# Patient Record
Sex: Female | Born: 1975
Health system: Southern US, Community
[De-identification: ages and names within clinical notes are randomized; demographics above are authoritative.]

## PROBLEM LIST (undated history)

## (undated) DIAGNOSIS — I499 Cardiac arrhythmia, unspecified: Secondary | ICD-10-CM

## (undated) DIAGNOSIS — K219 Gastro-esophageal reflux disease without esophagitis: Secondary | ICD-10-CM

## (undated) HISTORY — PX: TUBAL LIGATION: SHX77

---

## 1998-06-02 ENCOUNTER — Ambulatory Visit (HOSPITAL_COMMUNITY): Admission: RE | Admit: 1998-06-02 | Discharge: 1998-06-02 | Payer: Self-pay | Admitting: General Surgery

## 1998-06-02 ENCOUNTER — Encounter (HOSPITAL_BASED_OUTPATIENT_CLINIC_OR_DEPARTMENT_OTHER): Payer: Self-pay | Admitting: General Surgery

## 1999-08-30 ENCOUNTER — Ambulatory Visit (HOSPITAL_COMMUNITY): Admission: RE | Admit: 1999-08-30 | Discharge: 1999-08-30 | Payer: Self-pay | Admitting: *Deleted

## 1999-09-04 ENCOUNTER — Emergency Department (HOSPITAL_COMMUNITY): Admission: EM | Admit: 1999-09-04 | Discharge: 1999-09-04 | Payer: Self-pay | Admitting: Emergency Medicine

## 1999-10-25 ENCOUNTER — Ambulatory Visit (HOSPITAL_COMMUNITY): Admission: RE | Admit: 1999-10-25 | Discharge: 1999-10-25 | Payer: Self-pay | Admitting: *Deleted

## 1999-12-06 ENCOUNTER — Ambulatory Visit (HOSPITAL_COMMUNITY): Admission: RE | Admit: 1999-12-06 | Discharge: 1999-12-06 | Payer: Self-pay | Admitting: *Deleted

## 2000-01-05 ENCOUNTER — Inpatient Hospital Stay (HOSPITAL_COMMUNITY): Admission: AD | Admit: 2000-01-05 | Discharge: 2000-01-05 | Payer: Self-pay | Admitting: *Deleted

## 2000-01-10 ENCOUNTER — Ambulatory Visit (HOSPITAL_COMMUNITY): Admission: RE | Admit: 2000-01-10 | Discharge: 2000-01-10 | Payer: Self-pay | Admitting: *Deleted

## 2000-01-10 ENCOUNTER — Observation Stay (HOSPITAL_COMMUNITY): Admission: AD | Admit: 2000-01-10 | Discharge: 2000-01-11 | Payer: Self-pay | Admitting: Obstetrics

## 2000-01-10 ENCOUNTER — Encounter: Payer: Self-pay | Admitting: *Deleted

## 2000-01-11 ENCOUNTER — Encounter: Payer: Self-pay | Admitting: *Deleted

## 2000-01-13 ENCOUNTER — Inpatient Hospital Stay (HOSPITAL_COMMUNITY): Admission: AD | Admit: 2000-01-13 | Discharge: 2000-01-16 | Payer: Self-pay | Admitting: Obstetrics

## 2000-02-16 ENCOUNTER — Inpatient Hospital Stay (HOSPITAL_COMMUNITY): Admission: AD | Admit: 2000-02-16 | Discharge: 2000-02-16 | Payer: Self-pay | Admitting: Obstetrics

## 2000-07-28 ENCOUNTER — Other Ambulatory Visit: Admission: RE | Admit: 2000-07-28 | Discharge: 2000-07-28 | Payer: Self-pay | Admitting: Obstetrics

## 2000-07-31 ENCOUNTER — Encounter (INDEPENDENT_AMBULATORY_CARE_PROVIDER_SITE_OTHER): Payer: Self-pay

## 2000-07-31 ENCOUNTER — Other Ambulatory Visit: Admission: RE | Admit: 2000-07-31 | Discharge: 2000-07-31 | Payer: Self-pay | Admitting: Obstetrics

## 2001-06-23 ENCOUNTER — Inpatient Hospital Stay (HOSPITAL_COMMUNITY): Admission: AD | Admit: 2001-06-23 | Discharge: 2001-06-23 | Payer: Self-pay | Admitting: Obstetrics and Gynecology

## 2002-06-24 ENCOUNTER — Ambulatory Visit (HOSPITAL_COMMUNITY): Admission: RE | Admit: 2002-06-24 | Discharge: 2002-06-24 | Payer: Self-pay | Admitting: *Deleted

## 2002-12-10 ENCOUNTER — Encounter: Admission: RE | Admit: 2002-12-10 | Discharge: 2002-12-10 | Payer: Self-pay | Admitting: Obstetrics and Gynecology

## 2004-04-24 ENCOUNTER — Inpatient Hospital Stay (HOSPITAL_COMMUNITY): Admission: AD | Admit: 2004-04-24 | Discharge: 2004-04-26 | Payer: Self-pay | Admitting: *Deleted

## 2004-04-24 ENCOUNTER — Encounter (INDEPENDENT_AMBULATORY_CARE_PROVIDER_SITE_OTHER): Payer: Self-pay | Admitting: *Deleted

## 2006-02-14 ENCOUNTER — Ambulatory Visit: Payer: Self-pay | Admitting: Cardiovascular Disease

## 2006-02-22 ENCOUNTER — Encounter: Payer: Self-pay | Admitting: Cardiology

## 2006-02-22 ENCOUNTER — Ambulatory Visit: Payer: Self-pay

## 2006-05-12 ENCOUNTER — Ambulatory Visit: Payer: Self-pay | Admitting: Cardiovascular Disease

## 2006-05-23 ENCOUNTER — Inpatient Hospital Stay (HOSPITAL_COMMUNITY): Admission: AD | Admit: 2006-05-23 | Discharge: 2006-05-23 | Payer: Self-pay | Admitting: *Deleted

## 2006-05-24 ENCOUNTER — Inpatient Hospital Stay (HOSPITAL_COMMUNITY): Admission: AD | Admit: 2006-05-24 | Discharge: 2006-05-24 | Payer: Self-pay | Admitting: Obstetrics and Gynecology

## 2006-07-09 ENCOUNTER — Inpatient Hospital Stay (HOSPITAL_COMMUNITY): Admission: AD | Admit: 2006-07-09 | Discharge: 2006-07-11 | Payer: Self-pay | Admitting: Obstetrics and Gynecology

## 2006-07-12 ENCOUNTER — Encounter: Admission: RE | Admit: 2006-07-12 | Discharge: 2006-08-10 | Payer: Self-pay | Admitting: Obstetrics and Gynecology

## 2006-08-11 ENCOUNTER — Encounter: Admission: RE | Admit: 2006-08-11 | Discharge: 2006-09-10 | Payer: Self-pay | Admitting: Obstetrics and Gynecology

## 2006-09-11 ENCOUNTER — Encounter: Admission: RE | Admit: 2006-09-11 | Discharge: 2006-10-10 | Payer: Self-pay | Admitting: Obstetrics and Gynecology

## 2006-10-11 ENCOUNTER — Encounter: Admission: RE | Admit: 2006-10-11 | Discharge: 2006-11-10 | Payer: Self-pay | Admitting: Obstetrics and Gynecology

## 2006-11-11 ENCOUNTER — Encounter: Admission: RE | Admit: 2006-11-11 | Discharge: 2006-12-11 | Payer: Self-pay | Admitting: Obstetrics and Gynecology

## 2006-12-12 ENCOUNTER — Encounter: Admission: RE | Admit: 2006-12-12 | Discharge: 2007-01-10 | Payer: Self-pay | Admitting: Obstetrics and Gynecology

## 2007-01-11 ENCOUNTER — Encounter: Admission: RE | Admit: 2007-01-11 | Discharge: 2007-02-10 | Payer: Self-pay | Admitting: Obstetrics and Gynecology

## 2007-01-28 ENCOUNTER — Emergency Department (HOSPITAL_COMMUNITY): Admission: EM | Admit: 2007-01-28 | Discharge: 2007-01-28 | Payer: Self-pay | Admitting: Emergency Medicine

## 2007-02-11 ENCOUNTER — Encounter: Admission: RE | Admit: 2007-02-11 | Discharge: 2007-03-12 | Payer: Self-pay | Admitting: Obstetrics and Gynecology

## 2007-03-13 ENCOUNTER — Encounter: Admission: RE | Admit: 2007-03-13 | Discharge: 2007-04-12 | Payer: Self-pay | Admitting: Obstetrics and Gynecology

## 2007-04-13 ENCOUNTER — Encounter: Admission: RE | Admit: 2007-04-13 | Discharge: 2007-05-13 | Payer: Self-pay | Admitting: Obstetrics and Gynecology

## 2007-05-14 ENCOUNTER — Encounter: Admission: RE | Admit: 2007-05-14 | Discharge: 2007-06-09 | Payer: Self-pay | Admitting: Obstetrics and Gynecology

## 2007-06-11 ENCOUNTER — Encounter: Admission: RE | Admit: 2007-06-11 | Discharge: 2007-07-10 | Payer: Self-pay | Admitting: Obstetrics and Gynecology

## 2009-01-19 ENCOUNTER — Ambulatory Visit (HOSPITAL_COMMUNITY): Admission: RE | Admit: 2009-01-19 | Discharge: 2009-01-19 | Payer: Self-pay | Admitting: Family Medicine

## 2009-02-06 ENCOUNTER — Ambulatory Visit (HOSPITAL_COMMUNITY): Admission: RE | Admit: 2009-02-06 | Discharge: 2009-02-06 | Payer: Self-pay | Admitting: Obstetrics & Gynecology

## 2009-05-05 ENCOUNTER — Inpatient Hospital Stay (HOSPITAL_COMMUNITY): Admission: AD | Admit: 2009-05-05 | Discharge: 2009-05-05 | Payer: Self-pay | Admitting: Family Medicine

## 2009-06-01 ENCOUNTER — Ambulatory Visit: Payer: Self-pay | Admitting: Family Medicine

## 2009-11-06 ENCOUNTER — Ambulatory Visit (HOSPITAL_COMMUNITY): Admission: RE | Admit: 2009-11-06 | Discharge: 2009-11-06 | Payer: Self-pay | Admitting: Family Medicine

## 2010-03-18 ENCOUNTER — Inpatient Hospital Stay (HOSPITAL_COMMUNITY): Admission: AD | Admit: 2010-03-18 | Discharge: 2009-06-03 | Payer: Self-pay | Admitting: Obstetrics & Gynecology

## 2010-05-02 ENCOUNTER — Encounter: Payer: Self-pay | Admitting: Family Medicine

## 2010-07-02 LAB — CBC
HCT: 33.6 % — ABNORMAL LOW (ref 36.0–46.0)
Hemoglobin: 11.3 g/dL — ABNORMAL LOW (ref 12.0–15.0)
MCV: 87.1 fL (ref 78.0–100.0)
MCV: 87.7 fL (ref 78.0–100.0)
Platelets: 185 10*3/uL (ref 150–400)
RBC: 3.52 MIL/uL — ABNORMAL LOW (ref 3.87–5.11)
RBC: 3.86 MIL/uL — ABNORMAL LOW (ref 3.87–5.11)

## 2010-08-09 ENCOUNTER — Inpatient Hospital Stay (INDEPENDENT_AMBULATORY_CARE_PROVIDER_SITE_OTHER)
Admission: RE | Admit: 2010-08-09 | Discharge: 2010-08-09 | Disposition: A | Discharge status: Home / Self Care | Payer: Self-pay | Source: Ambulatory Visit | Attending: Family Medicine | Admitting: Family Medicine

## 2010-08-09 DIAGNOSIS — S40029A Contusion of unspecified upper arm, initial encounter: Secondary | ICD-10-CM

## 2010-08-27 NOTE — Discharge Summary (Signed)
[facility]  Patient:    [name], [name]                       MRN: [id number] Adm. Date:  [id number] Disc. Date: [id number] Attending:  Michaelle Copas Dictator:   Kevin Fenton, M.D.                           Discharge Summary  ADMITTING DIAGNOSES:          1. Intrauterine pregnancy at term.                               2. Placenta previa.  PROCEDURES:                   Primary low transverse cesarean section.  BRIEF HOSPITAL COURSE:        This is a 36 year old G4, P1-0-2-1 at 37 weeks and 5 days by 19 week ultrasound who is here for a cesarean section secondary to placenta previa.  Patient underwent primary low transverse cesarean section without complications.  Patient had an uncomplicated postoperative course and was discharged home on the third postoperative day.  Patient was discharged home on ibuprofen 800 mg q.6h., Percocet one to two tabs q.4h. as needed, iron sulfate 325 mg b.i.d. for 12 weeks secondary to hemoglobin of 7.9, prenatal vitamins one every day for the next six weeks, and Colace 100 mg b.i.d. as needed to soften stools.  [facility] six [facility] postpartum check.  She was given follow-up instructions as to when to return ______ including fever, foul discharge, incision opening.  Patients staples were removed prior to discharge. DD:  01/16/00 TD:  01/17/00 Job: [id number]

## 2010-08-27 NOTE — Assessment & Plan Note (Signed)
[facility]                            CARDIOLOGY OFFICE NOTE   NAME:[name], [name]                        MRN:          [id number]  DATE:05/12/2006                            DOB:          Jul 01, 1975    [name] returns today for followup.  She is gravida 4 para 3.  I have seen  her in the past for PACs and PVCs.  Her last echo done in November of  2007 showed normal structural heart with no evidence of congenital heart  disease.  She has not noticed any significant palpitations since I last  saw her.  Her pregnancy is going well.  Her due date is the beginning of  April.  She has had a vaginal delivery followed by a C-section, then a V-  BAK.  I think she is going to have another vaginal delivery.  From a  cardiac perspective she is stable.  She is only taking prenatal  vitamins.  She has actually gained a little more weight with this  pregnancy and has tried to stay more active, working out and walking on  the treadmill at the gym.  She has not had any significant chest pain,  palpitations, or syncope.   Her blood pressure has been running somewhat low.  I told her to make  sure she stayed hydrated and to lie on her left side should she feel  presyncopal.   Her weight today is 153, with weight in November of 137.  Her blood  pressure is 100/59, pulse is 93 and regular.  Lungs are clear.  Carotids  are normal.  S1, S2 with normal heart sounds.  Abdomen is protuberant  consistent with gestational age.  Distal pulses are intact, with trace  edema.   IMPRESSION:  Stable.  Benign PACs and PVCs.  No evidence of structural  heart disease.  Should not have any problems with her delivery.  I will  see her on a p.r.n. basis.  Her echos in the past and in November showed  no valvular heart disease, no structural heart disease, and no history  of pericardium cardiomyopathy.  She will stay hydrated and follow up  with Dr. Cherly Hensen.  She is going to have her baby  at [facility].  We would be happy to see her should she have any arrhythmic problems  during the delivery or afterwards.     Noralyn Pick. Eden Emms, MD, [facility]  Electronically Signed    PCN/MedQ  DD: 05/12/2006  DT: 05/12/2006  Job #: [id number]   cc:   Maxie Better, M.D.

## 2010-08-27 NOTE — H&P (Signed)
[facility] ADMISSION   NAME:Wil[place], [facility]                        MRN:          [id number]  DATE:02/14/2006                            DOB:          [birth year]    REFERRING PHYSICIAN:  Maxie Better, M.D.   Ms. Hollon is seen today at the request of Dr. Cherly Hensen.   She is 35 years old.  She has been seen at the [facility] in 2001 by Dr.  Juanda Chance and in 2004 by myself.  She is, I believe, gravid 7, para 4.   Ever since we have known [place], she has had benign PACs and PVCs.  She tends  to have them all the time and we get consulted on her for these.  Around the  time of her pregnancy, she had echoes in 2001 and 2004 that were normal.  There was no evidence for congenital heart disease and good LV and RV  function.   She is currently 16 weeks' pregnant and has had increasing shortness of  breath.  In talking to her, the shortness of breath sounds physiologic.  It  particularly is bad going up steps.  She also tends to have shortness of  breath when her 3 children act up and cause her stress.  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   She does not have a history of lung disease.  There is no previous history  of cardiomyopathy or PE.  There has been no pleuritic pain and no cough or  sputum production.   She has only been taking prenatal vitamins.   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  She has not been overly active since she  has been pregnant.  She is to have an ultrasound of the baby next week.  She  is currently a gestational age of [redacted] weeks.   She is supposed to have a vaginal delivery.  She has had both previously.   REVIEW OF SYSTEMS:  Otherwise benign.  She has never had syncope and does  not notice any palpitations.   FAMILY HISTORY:  Remarkable for no significant cardiomyopathy and no  premature heart disease.  She does not drink or smoke.   EXAM:  She is  a thin, healthy appearing black female.  Her blood pressure is  low at 98/60.  Her pulse is 80 with occasional PACs.  HEENT:  Normal.  There is no lymphadenopathy.  There is no thyromegaly.  LUNGS:  Clear.  Carotids are normal.  There is a S1, S2 with normal heart sounds.  ABDOMEN:  Protuberant, consistent with gestational age.  Distal pulses are intact with no edema.   EKG shows a sinus rhythm with occasional PVCs and is similar to previous  EKGs we have had on her.   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  I think it is  reasonable to recheck an echocardiogram since it has been over 3 years.  So  long as it does not  show evidence of structural heart disease, I will just  follow her.  I think her shortness of breath is physiologic.  I find nothing  on examination to suggest that there is pathological dyspnea.   Since her blood pressure is low, I would not treat her with beta blockers at  this time in regards to her PACs and PVCs, as she has had them for a long  time and has not had any complications from her previous deliveries.   We will do a 2D echocardiogram.  So long as it is normal, I will see her  back in about 8 weeks and we will be happy to follow her along during her  pregnancy.    ______________________________  Noralyn Pick Eden Emms, MD, [facility]    PCN/MedQ  DD: 02/14/2006  DT: 02/14/2006  Job #: [id number]   cc:   Maxie Better, M.D.

## 2010-08-27 NOTE — Group Therapy Note (Signed)
   [name], [name]                          ACCOUNT NO.:  [id number]   MEDICAL RECORD NO.:  [id number]                   PATIENT TYPE:  OUT   LOCATION:  [facility]                           FACILITY:  [facility]   PHYSICIAN:  Elsie Lincoln, MD                   DATE OF BIRTH:  1976/01/16   DATE OF SERVICE:  12/10/2002                                    CLINIC NOTE   HISTORY OF PRESENT ILLNESS:  The patient is a 35 year old para 2-0-3-2 with  a history of an abnormal Pap smear done at the [facility] department with  followup biopsy done here at this clinic in April 2004.  Biopsy was negative  and endocervical curettage was also negative.  The patient had a followup  Pap smear at the [facility] yesterday and results are unknown.  Also,  the patient comes for followup of transvaginal ultrasound that was performed  in April 2004.  The practitioner at the health department in March 2004 had  questionably felt an adnexal mass and sent her for this ultrasound.   Ultrasound report showed a normal size uterus with an endometrial stripe of  0.4 cm.  The right ovary was 3 x 1.5 x 2 with a follicular cyst ovary with a  2 x 2.5 x 2 cm and had normal ultrasound appearance.  There was no free  fluid.  This is a normal ultrasound.  The patient has never had any pain or  abnormal bleeding.  The patient has no complaints today and no changes in  health status.  She is on Ortho Evra for her birth control and as stated  earlier she follows up with the health department for her Pap smears.  There  is no medical issue today.   PLAN:  The patient is to return to the health department for Pap smears.  If  there is an issue with pain or any other gynecologic issues the patient is  to come to this clinic.                                               Elsie Lincoln, MD    KL/MEDQ  D:  12/10/2002  T:  12/10/2002  Job:  [id number]

## 2010-08-27 NOTE — Op Note (Signed)
[facility]  Patient:    [name], [name]                       MRN: [id number] Proc. Date: 01/13/00 Adm. Date:  [id number] Disc. Date: [id number] Attending:  Michaelle Copas                           Operative Report  PREOPERATIVE DIAGNOSIS:       Intrauterine pregnancy at 37 weeks with placenta previa.  POSTOPERATIVE DIAGNOSIS:      Intrauterine pregnancy at 37 weeks with placenta previa.  OPERATION:                    Low transverse cesarean.  OPERATOR:                     Conni Elliot, M.D.  ANESTHESIA:                   Spinal followed by general.  OPERATIVE FINDINGS:           Female infant with Apgars of 8 and 9 at one and five respectively.  PROCEDURE:                    After attempting spinal anesthetic which was ineffective, the patient was prepped and draped in a sterile fashion.  The patient was then placed under general oral tracheal anesthesia.  Abdomen was entered through a low transverse Pfannenstiel incision.  Incision made in skin, extended to fascia.  Rectus muscle separated in the midline.  Peritoneal cavity and bladder flap created.  A low transuterine incision was made.  Baby was delivered from the vertex presentation.  Cord double clamped and cut and baby handed to neonatologists in attendance.  Placenta ______ spontaneously. Uterus, bladder flap, anteperitoneum, fascia, skin were closed ______. Estimated blood loss approximately 1000 cc without replacement.  Instruments were correct. DD:  02/10/00 TD:  02/10/00 Job: [id number]

## 2010-11-22 IMAGING — US US ABDOMEN COMPLETE
1 series · 14 of 25 positions shown · non-contrast
Comparison: None.

CLINICAL DATA: Abdominal pain, gastroesophageal reflux disease.

COMPLETE ABDOMINAL ULTRASOUND

[Series 1: us abdomen complete · 0.21mm/px · 14 of 73 slices shown]
[im 1/73]
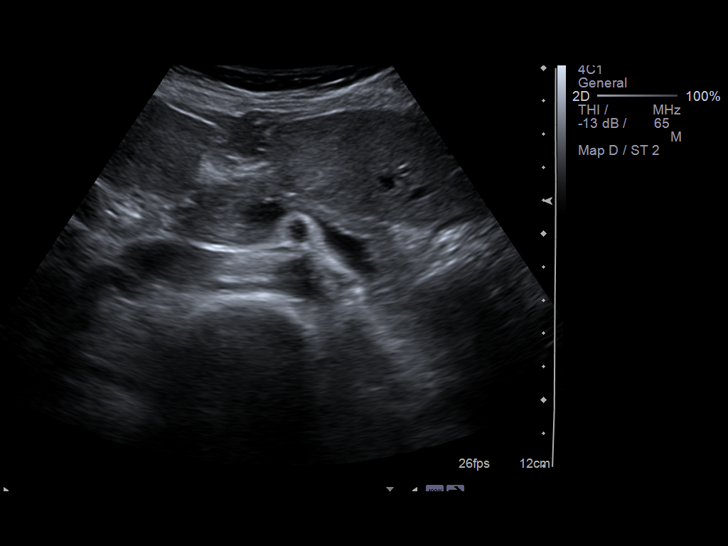
[im 7/73]
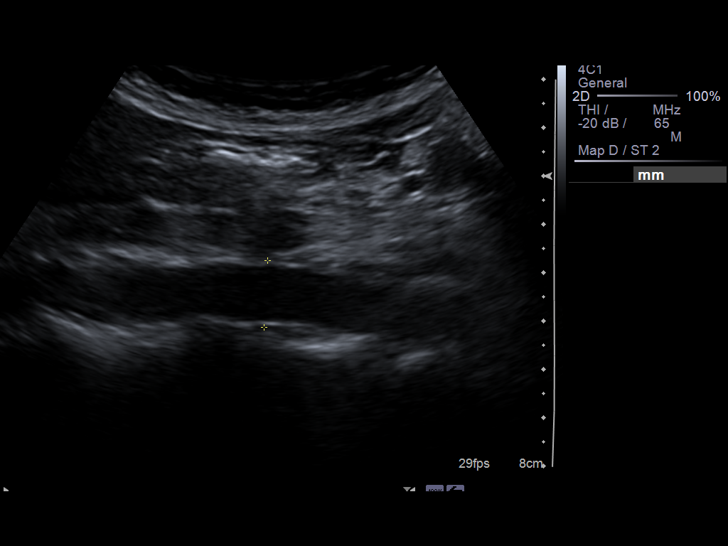
[im 13/73]
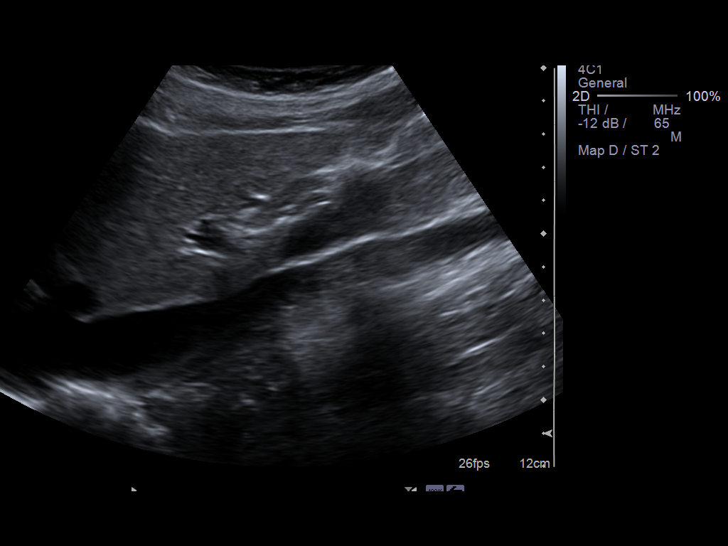
[im 19/73]
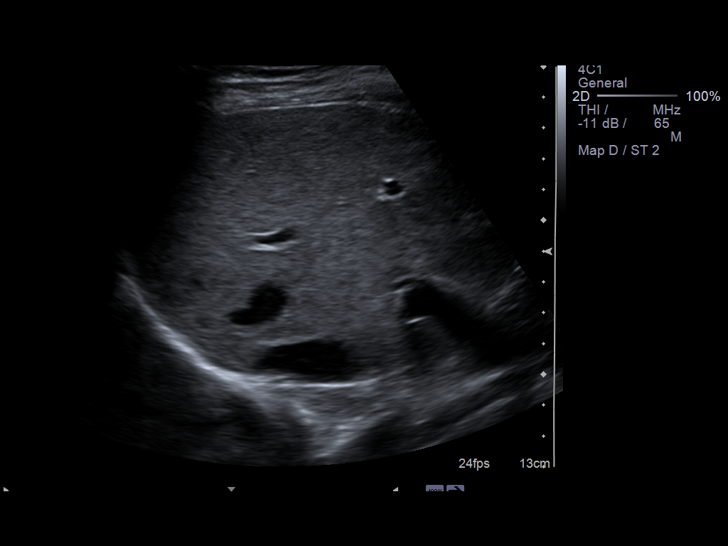
[im 25/73]
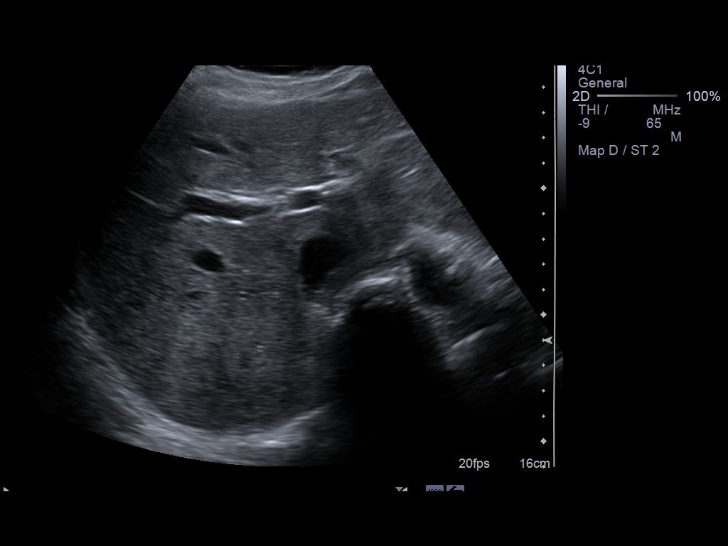
[im 28/73]
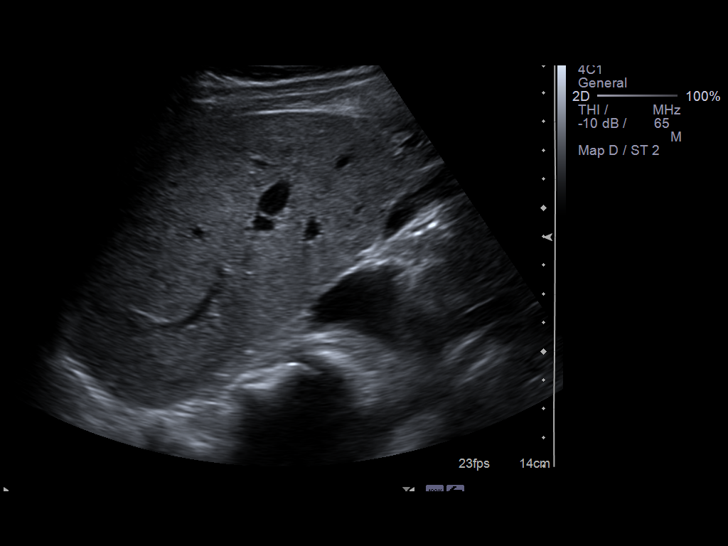
[im 34/73]
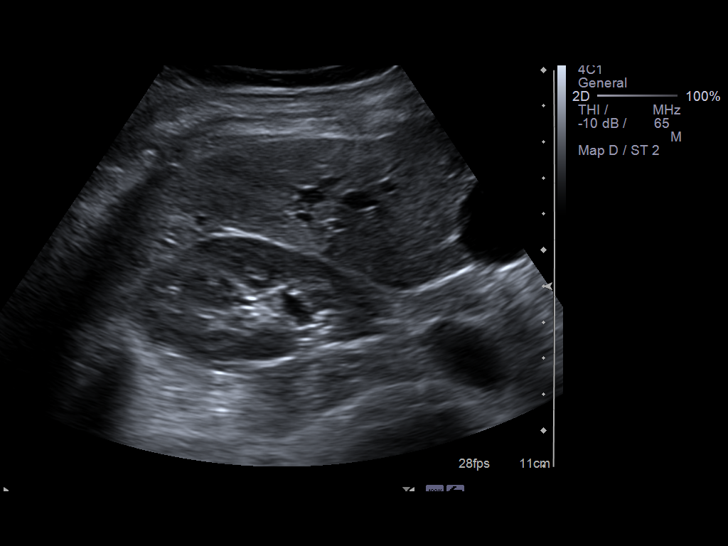
[im 40/73]
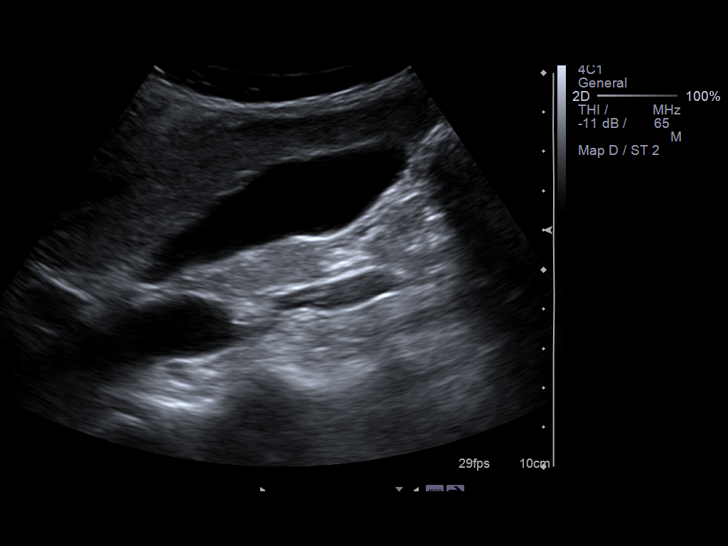
[im 46/73]
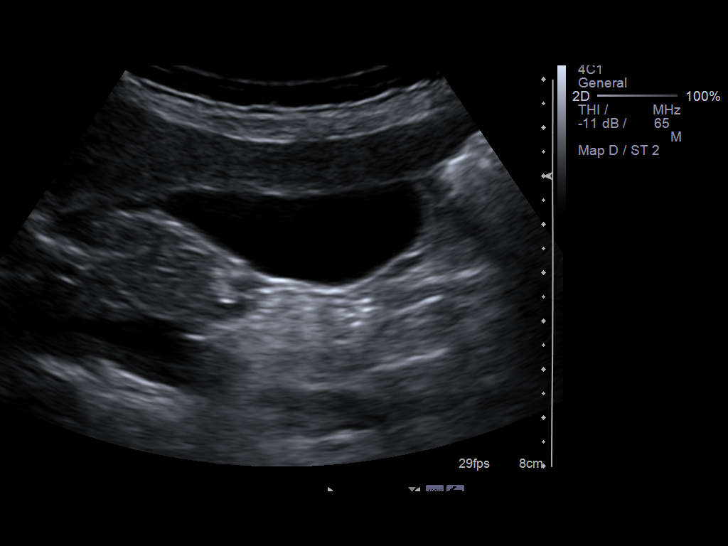
[im 49/73]
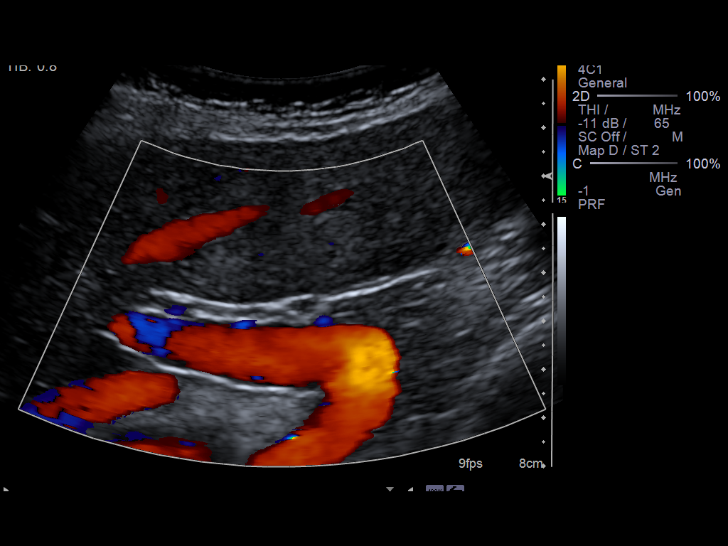
[im 55/73]
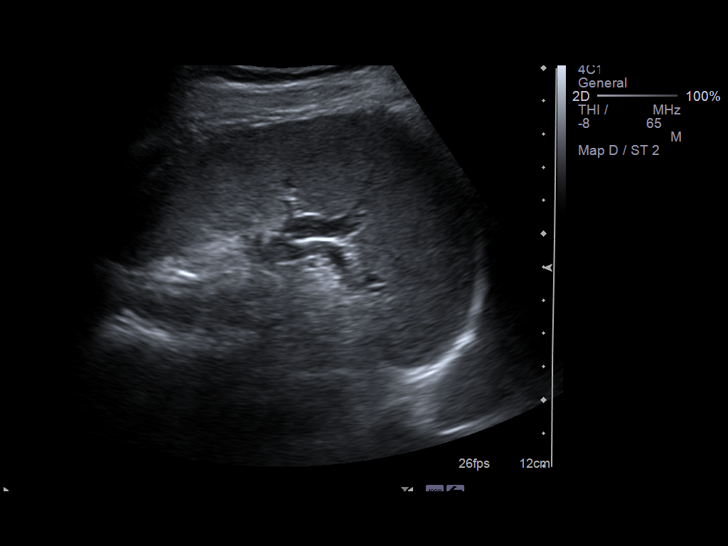
[im 61/73]
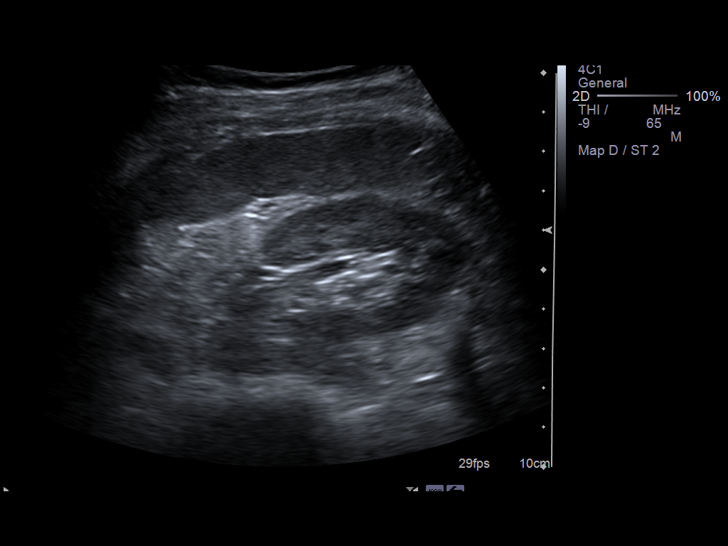
[im 67/73]
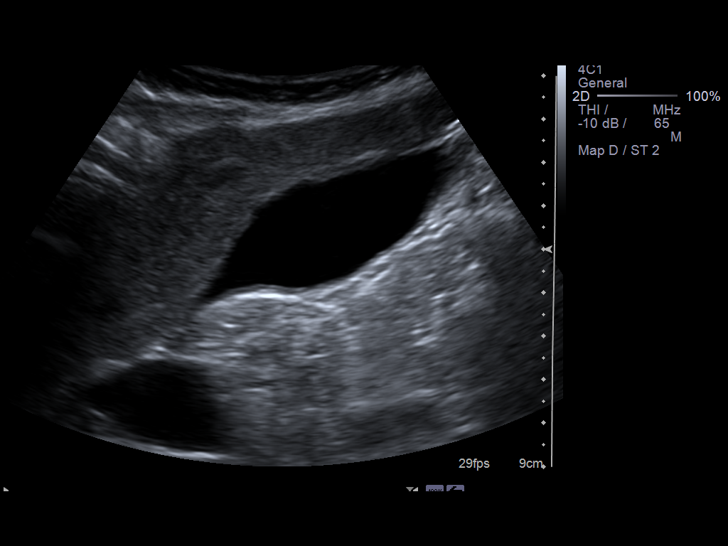
[im 73/73]
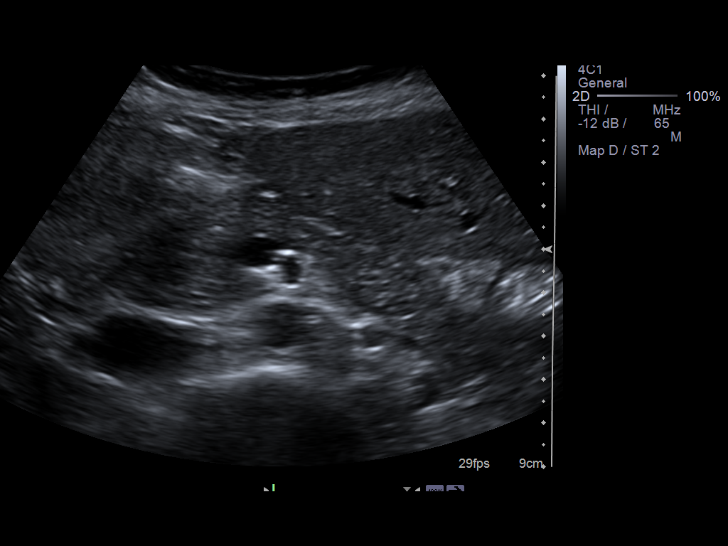

[14 of 25 positions shown; findings below may reference images not displayed]

FINDINGS: Gallbladder:  Sonographically normal without sludge or gallstones
with normal 2 mm wall thickness and no sonographic Murphy's sign
evoked.

Common bile duct:  No dilated intrahepatic or extrahepatic bile
ducts with common bile duct measuring normally at 2 mm.

Liver:  No focal lesion identified.  Within normal limits in
parenchymal echogenicity.

IVC:  Appears normal.

Pancreas:  No focal abnormality seen.

Spleen:  Sonographically normal measuring 9.9 cm long.

Right Kidney:  Sonographically normal measuring 10.2 cm long.

Left Kidney:  Sonographically normal measuring 10.2 cm long.

Abdominal aorta:  No aneurysm identified with maximum proximal
diameter 2.0 cm.

No free fluid.
IMPRESSION: Normal.

## 2010-11-22 IMAGING — RF DG ESOPHAGUS
14 of 24 series · 14 of 24 positions shown · non-contrast
Comparison: [HOSPITAL] abdominal ultrasound 11/06/2009.

CLINICAL DATA: Abdominal pain, gastroesophageal reflux disease.

ESOPHOGRAM/BARIUM SWALLOW
TECHNIQUE: Combined double contrast and single contrast
examination performed using effervescent crystals, thick barium
liquid, and thin barium liquid. 13mm barium tablet ingested with
water.
Fluoroscopy time:  1.6 minutes.

[Series 1: run · 1 of 3 slices shown (1 of 14)]
[im 1/3]
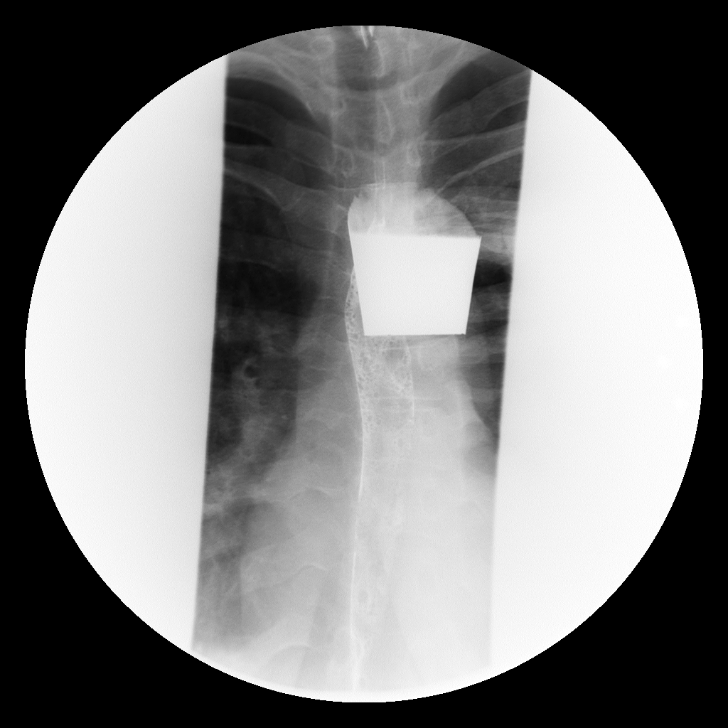

[Series 3: run · 1 of 1 slices shown (2 of 14)]
[im 1/1]
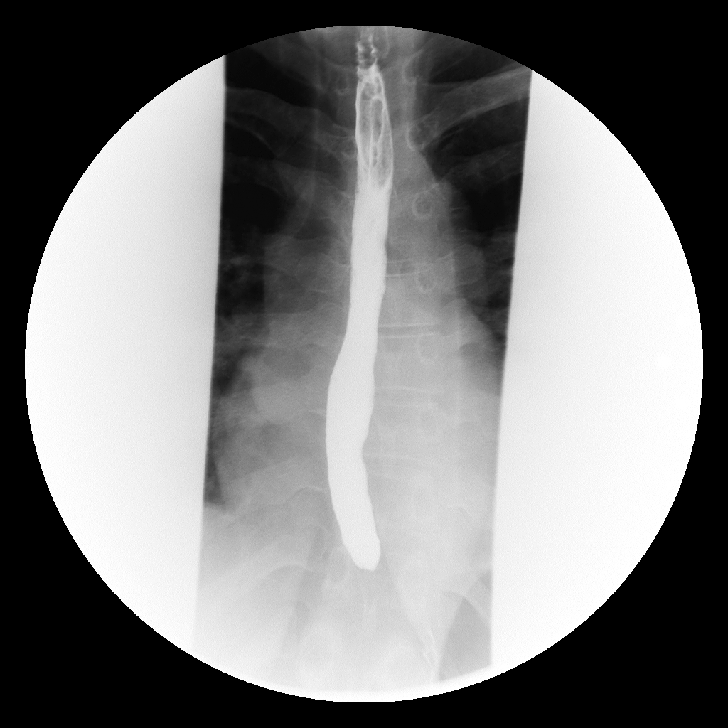

[Series 5: run · 1 of 1 slices shown (3 of 14)]
[im 1/1]
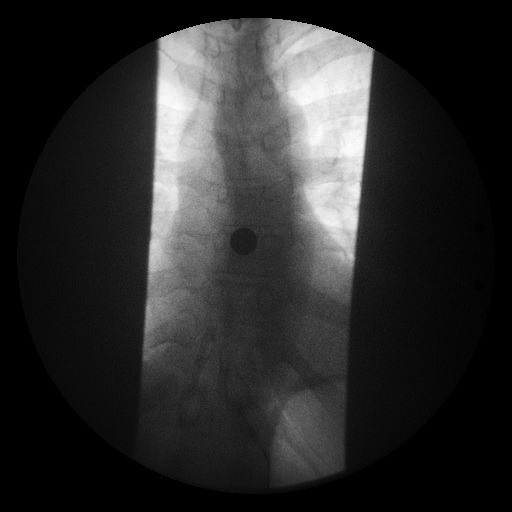

[Series 7: run · 1 of 1 slices shown (4 of 14)]
[im 1/1]
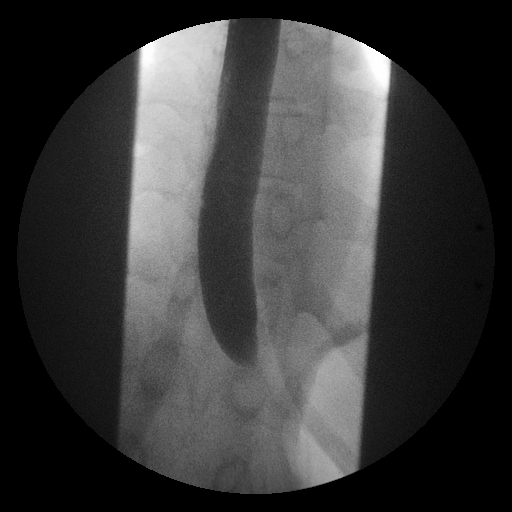

[Series 8: run · 1 of 1 slices shown (5 of 14)]
[im 1/1]
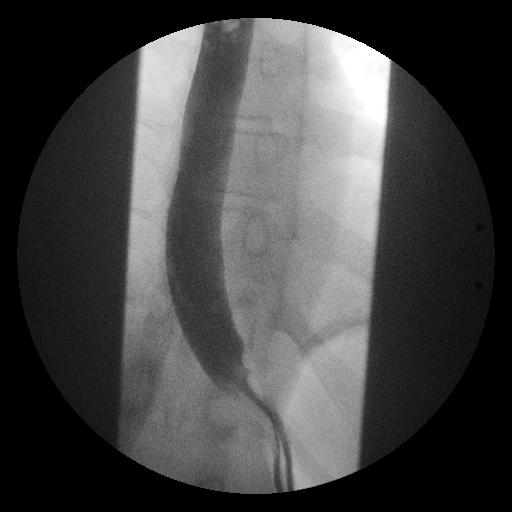

[Series 11: run · 1 of 1 slices shown (6 of 14)]
[im 1/1]
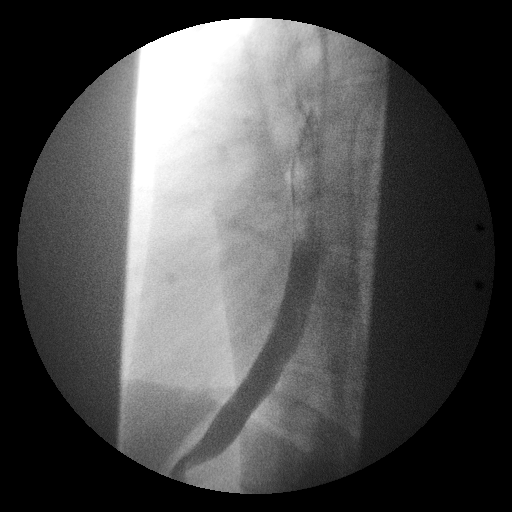

[Series 13: run · 1 of 7 slices shown (7 of 14)]
[im 1/7]
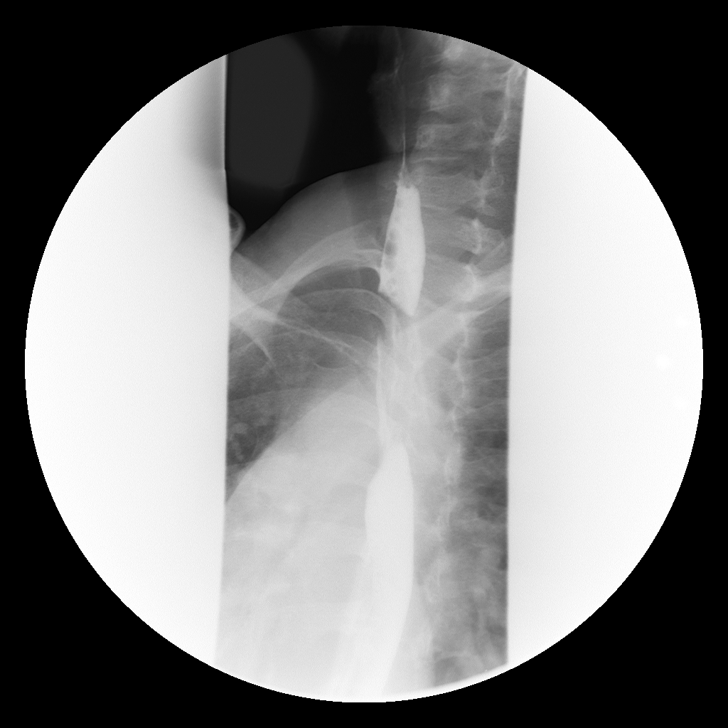

[Series 14: run · 1 of 2 slices shown (8 of 14)]
[im 1/2]
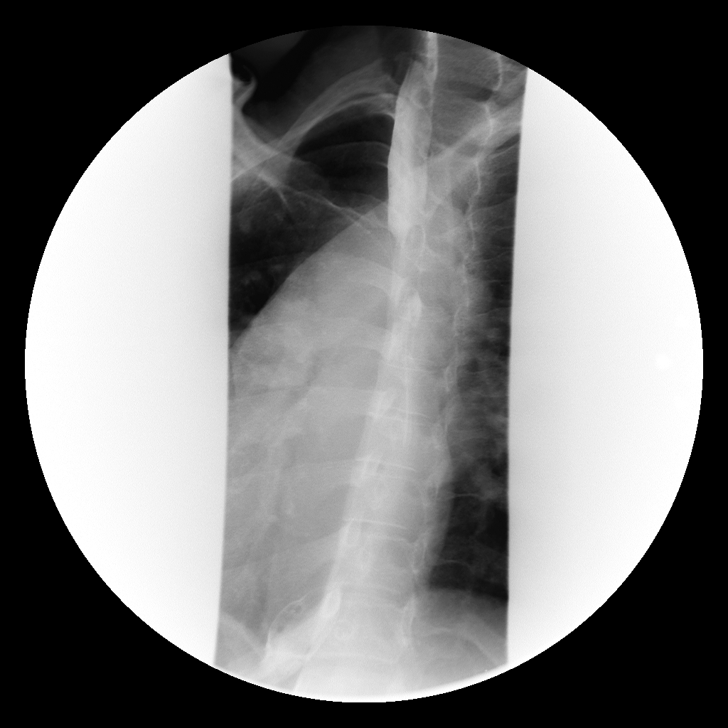

[Series 16: run · 1 of 3 slices shown (9 of 14)]
[im 1/3]
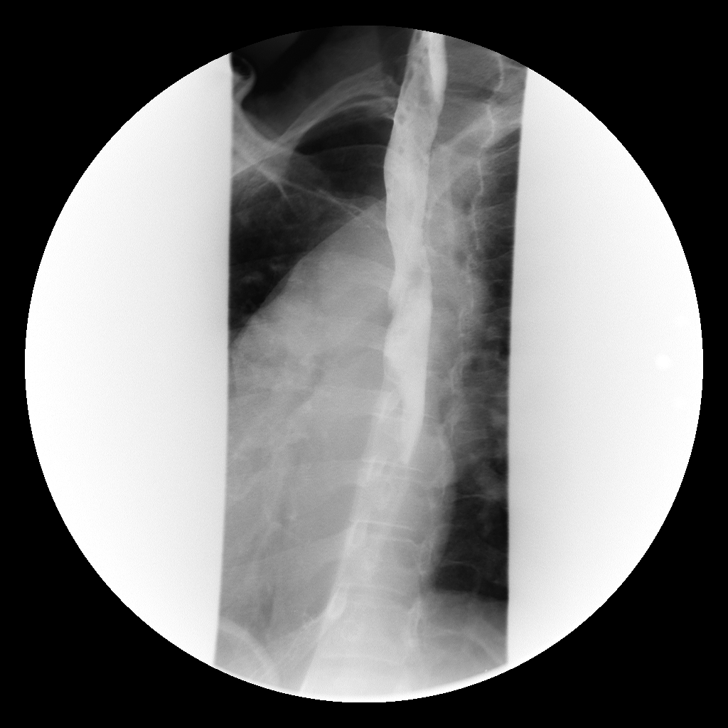

[Series 18: run · 1 of 1 slices shown (10 of 14)]
[im 1/1]
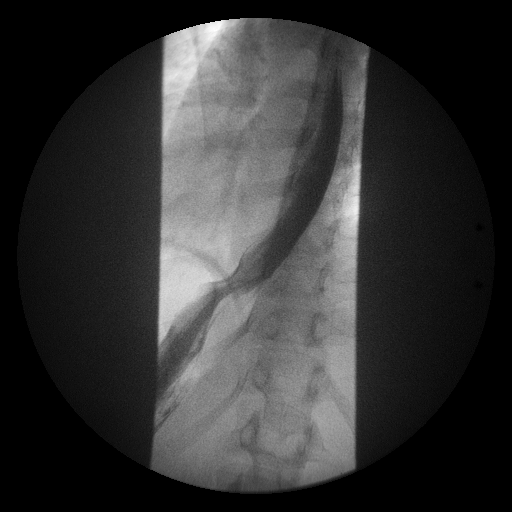

[Series 20: run · 1 of 1 slices shown (11 of 14)]
[im 1/1]
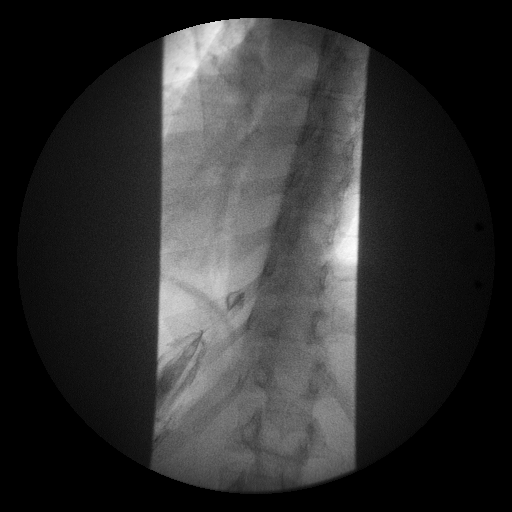

[Series 21: run · 1 of 1 slices shown (12 of 14)]
[im 1/1]
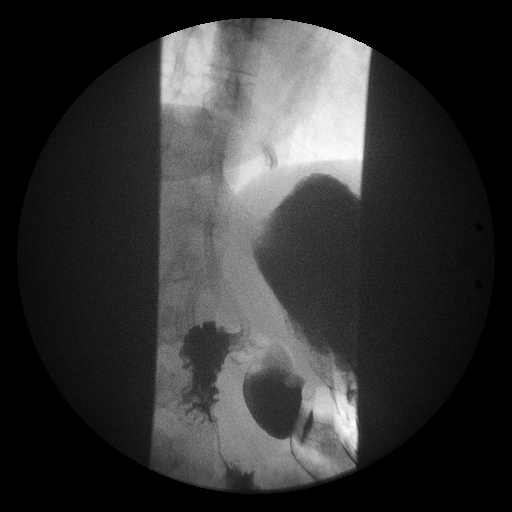

[Series 23: run · 1 of 1 slices shown (13 of 14)]
[im 1/1]
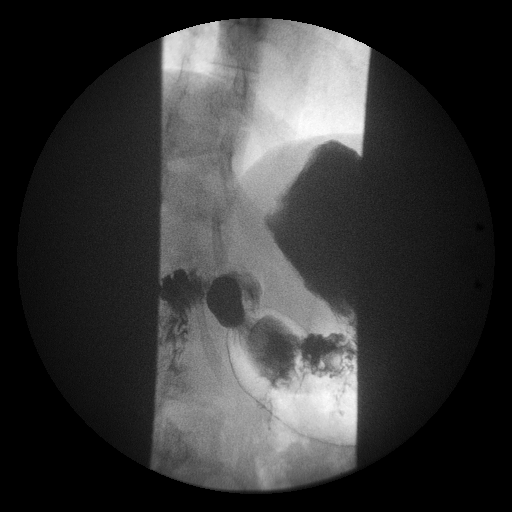

[Series 25: run · 1 of 1 slices shown (14 of 14)]
[im 1/1]
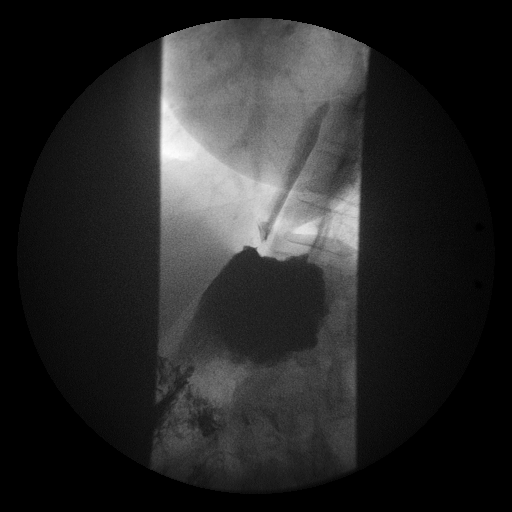

[14 of 24 positions shown; findings below may reference images not displayed]

FINDINGS: Normal antegrade peristalsis is seen through the
cervical and thoracic esophagus with no intrinsic or extrinsic
esophageal lesions.  Ingested 13 mm barium tablet readily passed
into the stomach.  No spontaneous with slight induced
gastroesophageal reflux (Valsalva/water siphon).
IMPRESSION: 1.  Slight induced gastroesophageal reflux.
2.  Otherwise, normal.

## 2011-01-26 ENCOUNTER — Other Ambulatory Visit: Payer: Self-pay | Admitting: Family Medicine

## 2011-01-26 ENCOUNTER — Other Ambulatory Visit (HOSPITAL_COMMUNITY)
Admission: RE | Admit: 2011-01-26 | Discharge: 2011-01-26 | Disposition: A | Discharge status: Home / Self Care | Payer: 59 | Source: Ambulatory Visit | Attending: Family Medicine | Admitting: Family Medicine

## 2011-01-26 DIAGNOSIS — Z1159 Encounter for screening for other viral diseases: Secondary | ICD-10-CM | POA: Insufficient documentation

## 2011-01-26 DIAGNOSIS — Z01419 Encounter for gynecological examination (general) (routine) without abnormal findings: Secondary | ICD-10-CM | POA: Insufficient documentation

## 2011-04-26 ENCOUNTER — Encounter (HOSPITAL_COMMUNITY): Payer: Self-pay | Admitting: *Deleted

## 2011-04-26 ENCOUNTER — Emergency Department (INDEPENDENT_AMBULATORY_CARE_PROVIDER_SITE_OTHER)
Admission: EM | Admit: 2011-04-26 | Discharge: 2011-04-26 | Disposition: A | Discharge status: Home / Self Care | Payer: 59 | Source: Home / Self Care | Attending: Emergency Medicine | Admitting: Emergency Medicine

## 2011-04-26 DIAGNOSIS — R059 Cough, unspecified: Secondary | ICD-10-CM

## 2011-04-26 DIAGNOSIS — K219 Gastro-esophageal reflux disease without esophagitis: Secondary | ICD-10-CM | POA: Insufficient documentation

## 2011-04-26 DIAGNOSIS — R058 Other specified cough: Secondary | ICD-10-CM

## 2011-04-26 DIAGNOSIS — R05 Cough: Secondary | ICD-10-CM

## 2011-04-26 HISTORY — DX: Gastro-esophageal reflux disease without esophagitis: K21.9

## 2011-04-26 HISTORY — DX: Cardiac arrhythmia, unspecified: I49.9

## 2011-04-26 MED ORDER — AZITHROMYCIN 250 MG PO TABS: 250.0000 mg | ORAL_TABLET | Freq: Every day | ORAL | Status: AC

## 2011-04-26 MED ORDER — PREDNISONE 10 MG PO TABS: 20.0000 mg | ORAL_TABLET | Freq: Every day | ORAL | Status: AC

## 2011-04-26 NOTE — ED Notes (Signed)
Onset 5 days ago upper  chest tightness with a  Productive cough.  She has continued to cough and has post nasal drip , but denies fever, ear pain or sore throat.

## 2011-04-26 NOTE — ED Provider Notes (Signed)
History     CSN: [id number]  Arrival date & time 04/26/11  0919   First MD Initiated Contact with Patient 04/26/11 1023      Chief Complaint  Patient presents with  . URI    (Consider location/radiation/quality/duration/timing/severity/associated sxs/prior treatment) HPI Comments: Been coughing with tighteness sensation in my chest, have this yellow-greenish sputum, its sore right here ( see illustration)  Patient is a 36 y.o. female presenting with URI. The history is provided by the patient.  URI The primary symptoms include cough. Primary symptoms do not include fever, sore throat, wheezing, vomiting, myalgias, arthralgias or rash. The current episode started 3 to 5 days ago. This is a new problem. The problem has not changed since onset. The illness is not associated with chills, sinus pressure or congestion.    Past Medical History  Diagnosis Date  . GERD (gastroesophageal reflux disease)   . Irregular heart rate     Past Surgical History  Procedure Date  . Tubal ligation     Family History  Problem Relation Age of Onset  . Asthma Mother     History  Substance Use Topics  . Smoking status: Never Smoker   . Smokeless tobacco: Not on file  . Alcohol Use: No    OB History    Grav Para Term Preterm Abortions TAB SAB Ect Mult Living                  Review of Systems  Constitutional: Negative for fever and chills.  HENT: Negative for congestion, sore throat and sinus pressure.   Respiratory: Positive for cough. Negative for wheezing.   Gastrointestinal: Negative for vomiting.  Musculoskeletal: Negative for myalgias and arthralgias.  Skin: Negative for rash.    Allergies  Review of patient's allergies indicates no known allergies.  Home Medications   Current Outpatient Rx  Name Route Sig Dispense Refill  . LANSOPRAZOLE 15 MG PO CPDR Oral Take 15 mg by mouth daily.    . AZITHROMYCIN 250 MG PO TABS Oral Take 1 tablet (250 mg total) by mouth daily.  Take first 2 tablets together, then 1 every day until finished. 6 tablet 0  . PREDNISONE 10 MG PO TABS Oral Take 2 tablets (20 mg total) by mouth daily. 15 tablet 0    BP 105/72  Pulse 75  Temp(Src) 98.3 F (36.8 C) (Oral)  Resp 20  SpO2 100%  LMP 04/09/2011  Physical Exam  Constitutional: She appears well-developed and well-nourished. No distress.  HENT:  Head: Atraumatic.  Right Ear: Tympanic membrane normal.  Left Ear: Tympanic membrane normal.  Mouth/Throat: Uvula is midline, oropharynx is clear and moist and mucous membranes are normal.  Eyes: Conjunctivae are normal. Right eye exhibits no discharge. Left eye exhibits no discharge.  Neck: Neck supple. No thyromegaly present.  Pulmonary/Chest: Effort normal. No accessory muscle usage. No respiratory distress. She has no wheezes. She has no rales. She exhibits no tenderness.    Lymphadenopathy:    She has no cervical adenopathy.  Skin: No rash noted.    ED Course  Procedures (including critical care time)  Labs Reviewed - No data to display No results found.   1. Cough with sputum       MDM  Afebrile, productive cough and parasternal focal pain. NORMAL  LUNG Meridee Score        Jimmie Molly, MD 04/26/11 [phone]

## 2011-05-17 ENCOUNTER — Encounter (HOSPITAL_COMMUNITY): Payer: Self-pay | Admitting: *Deleted

## 2011-05-17 ENCOUNTER — Emergency Department (INDEPENDENT_AMBULATORY_CARE_PROVIDER_SITE_OTHER)
Admission: EM | Admit: 2011-05-17 | Discharge: 2011-05-17 | Disposition: A | Discharge status: Home / Self Care | Payer: 59 | Source: Home / Self Care | Attending: Family Medicine | Admitting: Family Medicine

## 2011-05-17 DIAGNOSIS — J310 Chronic rhinitis: Secondary | ICD-10-CM

## 2011-05-17 DIAGNOSIS — B309 Viral conjunctivitis, unspecified: Secondary | ICD-10-CM

## 2011-05-17 LAB — POCT RAPID STREP A: Streptococcus, Group A Screen (Direct): NEGATIVE

## 2011-05-17 MED ORDER — FLUTICASONE PROPIONATE 50 MCG/ACT NA SUSP: 2.0000 | Freq: Every day | NASAL | Status: DC

## 2011-05-17 MED ORDER — POLYMYXIN B-TRIMETHOPRIM 10000-0.1 UNIT/ML-% OP SOLN: 1.0000 [drp] | OPHTHALMIC | Status: AC

## 2011-05-17 NOTE — ED Provider Notes (Signed)
History     CSN: [id number]  Arrival date & time 05/17/11  1214   First MD Initiated Contact with Patient 05/17/11 1402      Chief Complaint  Patient presents with  . Nasal Congestion    (Consider location/radiation/quality/duration/timing/severity/associated sxs/prior treatment) HPI Comments: [name] presents for evaluation of nasal congestion, rhinorrhea, eye redness. She reports her symptoms started yesterday. She also reports sore throat as well. She denies any cough. She denies any history of allergies. She reports similar symptoms in her son recently. His symptoms have resolved. Regarding her eyes, she reports bilateral redness. She also reports itching and constant tearing. Patient is a 36 y.o. female presenting with URI. The history is provided by the patient.  URI The primary symptoms include fatigue, sore throat and myalgias. Primary symptoms do not include cough. The current episode started yesterday. This is a new problem. The problem has not changed since onset. The sore throat began yesterday. The sore throat has been unchanged since its onset. The sore throat is mild in intensity. The sore throat is not accompanied by trouble swallowing.  Symptoms associated with the illness include congestion.    Past Medical History  Diagnosis Date  . GERD (gastroesophageal reflux disease)   . Irregular heart rate     Past Surgical History  Procedure Date  . Tubal ligation     Family History  Problem Relation Age of Onset  . Asthma Mother     History  Substance Use Topics  . Smoking status: Never Smoker   . Smokeless tobacco: Not on file  . Alcohol Use: No    OB History    Grav Para Term Preterm Abortions TAB SAB Ect Mult Living                  Review of Systems  Constitutional: Positive for fatigue.  HENT: Positive for congestion, sore throat and postnasal drip. Negative for trouble swallowing.   Eyes: Positive for discharge and redness.  Respiratory: Negative.   Negative for cough.   Cardiovascular: Negative.   Gastrointestinal: Negative.   Genitourinary: Negative.   Musculoskeletal: Positive for myalgias.  Skin: Negative.   Neurological: Negative.     Allergies  Review of patient's allergies indicates no known allergies.  Home Medications   Current Outpatient Rx  Name Route Sig Dispense Refill  . LANSOPRAZOLE 15 MG PO CPDR Oral Take 15 mg by mouth daily.    Marland Kitchen FLUTICASONE PROPIONATE 50 MCG/ACT NA SUSP Nasal Place 2 sprays into the nose daily. 16 g 2  . POLYMYXIN B-TRIMETHOPRIM 10000-0.1 UNIT/ML-% OP SOLN Both Eyes Place 1 drop into both eyes every 4 (four) hours. 10 mL 0    BP 120/83  Pulse 76  Temp(Src) 98.1 F (36.7 C) (Oral)  Resp 18  SpO2 99%  LMP 05/14/2011  Physical Exam  Nursing note and vitals reviewed. Constitutional: She is oriented to person, place, and time. She appears well-developed and well-nourished.  HENT:  Head: Normocephalic and atraumatic.  Right Ear: Tympanic membrane normal.  Left Ear: Tympanic membrane normal.  Mouth/Throat: Uvula is midline, oropharynx is clear and moist and mucous membranes are normal.  Eyes: EOM are normal. Pupils are equal, round, and reactive to light. Right eye exhibits no discharge and no exudate. Left eye exhibits no discharge and no exudate. Right conjunctiva is injected. Left conjunctiva is injected.  Neck: Normal range of motion.  Pulmonary/Chest: Effort normal and breath sounds normal. She has no wheezes. She has no rhonchi. She  has no rales.  Musculoskeletal: Normal range of motion.  Neurological: She is alert and oriented to person, place, and time.  Skin: Skin is warm and dry.  Psychiatric: Her behavior is normal.    ED Course  Procedures (including critical care time)   Labs Reviewed  POCT RAPID STREP A ([facility] ONLY)   No results found.   1. Viral conjunctivitis   2. Rhinitis       MDM  Advised to flush eyes with normal saline; given fluticasone for  nasal congestion; Polytrim eye drops if no improvement; strep test was negative        Richardo Priest, MD 05/17/11 1541

## 2011-05-17 NOTE — ED Notes (Signed)
2 days of redness in both eyes, cold sxs with sore throat and runny nose and sinus congestion.   No fever or cough

## 2011-05-27 NOTE — ED Notes (Addendum)
Pt. came in for a work note extension. States she started the Polytrim eye drops that day but was not well enough to return the next day 2/6. Pt.states she returned to work 2/8. Discussed with Dr. Chaney Malling and she approved note if it does not put her into FMLA. Note done to return to work 2/8 and given to pt. Vassie Moselle 05/27/2011

## 2011-12-07 ENCOUNTER — Ambulatory Visit (INDEPENDENT_AMBULATORY_CARE_PROVIDER_SITE_OTHER): Payer: Self-pay | Admitting: Emergency Medicine

## 2011-12-07 VITALS — BP 111/78 | HR 67 | Temp 98.7°F | Ht 65.5 in | Wt 132.0 lb

## 2011-12-07 DIAGNOSIS — R300 Dysuria: Secondary | ICD-10-CM | POA: Insufficient documentation

## 2011-12-07 DIAGNOSIS — R350 Frequency of micturition: Secondary | ICD-10-CM

## 2011-12-07 LAB — POCT URINALYSIS DIPSTICK
Leukocytes, UA: NEGATIVE
Urobilinogen, UA: 0.2

## 2011-12-07 LAB — POCT UA - MICROSCOPIC ONLY

## 2011-12-07 MED ORDER — CEPHALEXIN 500 MG PO CAPS: 500.0000 mg | ORAL_CAPSULE | Freq: Two times a day (BID) | ORAL | Status: DC

## 2011-12-07 MED ORDER — CEPHALEXIN 500 MG PO CAPS: 500.0000 mg | ORAL_CAPSULE | Freq: Two times a day (BID) | ORAL | Status: AC

## 2011-12-07 NOTE — Patient Instructions (Addendum)
It was nice to meet you!  I think you have a bladder infection that is starting to clear.  I have given you a prescription for Keflex.  Take twice a day for the next 3 days.  If your symptoms to not resolve in 1 week, please return to be re-evaluated.  If you are interested in coming here for your primary care, please get a new patient packet from the front desk on your way out.

## 2011-12-07 NOTE — Progress Notes (Signed)
  Subjective:    Patient ID: [name], female    DOB: 1975-08-07, 36 y.o.   MRN: [id number]  HPI ILISSA ROSNER is here for a SDA for urinary frequency.  She reports symptoms starting 2-3 days ago and including frequency, urgency with small amount of urine, pressure/burning sensation with urination, hematuria.  Has been drinking cranberry juice and eating dried cranberries.  Today, reports no dysuria or hematuria.  No fevers, nausea, vomiting. Having some clear vaginal discharge that is odorless.  No new sexual partners (monogamous with husband), h/o BTL for birth control.   Of note, patient has not been seen here previously.  She states that she is in the process of finding a new primary care doctor, but not sure of where she's going to go yet.  I have reviewed and updated the following as appropriate: allergies, current medications, past medical history, past social history and problem list SHx: non smoker  Review of Systems See HPI    Objective:   Physical Exam BP 111/78  Pulse 67  Temp 98.7 F (37.1 C) (Oral)  Ht 5' 5.5" (1.664 m)  Wt 132 lb (59.875 kg)  BMI 21.63 kg/m2 Gen: alert, cooperative, NAD Abd: +BS, soft, ND, minimal tenderness in epigastric, no suprapubic tenderness Back: no CVA tenderness      Assessment & Plan:

## 2011-12-07 NOTE — Assessment & Plan Note (Signed)
History and microscopic urine consistent with UTI.  Will treat with Keflex 500mg  BID x3 days.  Patient instructed to return if not better in the next week.  Would culture and perform pelvic exam at that time if continued symptoms.

## 2012-04-16 ENCOUNTER — Other Ambulatory Visit: Payer: Self-pay | Admitting: Obstetrics and Gynecology

## 2012-04-16 DIAGNOSIS — N63 Unspecified lump in unspecified breast: Secondary | ICD-10-CM

## 2012-04-19 ENCOUNTER — Other Ambulatory Visit: Payer: Self-pay

## 2012-04-25 ENCOUNTER — Ambulatory Visit
Admission: RE | Admit: 2012-04-25 | Discharge: 2012-04-25 | Disposition: A | Discharge status: Home / Self Care | Payer: 59 | Source: Ambulatory Visit | Attending: Obstetrics and Gynecology | Admitting: Obstetrics and Gynecology

## 2012-04-25 ENCOUNTER — Other Ambulatory Visit: Payer: Self-pay | Admitting: Obstetrics and Gynecology

## 2012-04-25 DIAGNOSIS — N63 Unspecified lump in unspecified breast: Secondary | ICD-10-CM

## 2014-06-04 ENCOUNTER — Encounter: Payer: Self-pay | Admitting: *Deleted

## 2014-06-04 ENCOUNTER — Encounter: Payer: 59 | Attending: Obstetrics and Gynecology | Admitting: *Deleted

## 2014-06-04 DIAGNOSIS — R7303 Prediabetes: Secondary | ICD-10-CM

## 2014-06-04 DIAGNOSIS — K219 Gastro-esophageal reflux disease without esophagitis: Secondary | ICD-10-CM | POA: Diagnosis not present

## 2014-06-04 DIAGNOSIS — Z713 Dietary counseling and surveillance: Secondary | ICD-10-CM | POA: Diagnosis not present

## 2014-06-04 DIAGNOSIS — R7309 Other abnormal glucose: Secondary | ICD-10-CM | POA: Insufficient documentation

## 2014-06-04 NOTE — Progress Notes (Signed)
  Medical Nutrition Therapy:  Appt start time: 0800 end time:  0900.   Assessment:  Primary concerns today: [name] is here for nutrition counseling pertaining to recent diagnosis of prediabetes.  She thinks her most recent A1c is 5.8%. She thinks her grandmother had diabetes, but she isn't sure.   [name] does the grocery shopping with her husband and they both share cooking responsibilities.  She says they bake most often.  They eat out maybe 3-4 times/week. When at home she eating standing up or in her bedroom or eating on the go.  She says she doesn't take care of her own needs and focuses on her kids most of the time.   She is a fast eater.  She admits to skipping meals.  She also has GERD and that affects her food intake.1-  Preferred Learning Style:   Auditory  Visual   Learning Readiness:   Ready   MEDICATIONS: see list   DIETARY INTAKE:  Usual eating pattern includes 1-3 meals and 4 snacks per day. Likes to [employer] on fruits and sweets.  She is trying to eat leaner meats and whole grains  Everyday foods include proteins, starches, fruits.  Avoided foods include acidic foods, sweet tea and sugary beverages, fried foods.    24-hr recall:  8:30 am- soothie from juice shop  Noon: peanut butter and jelly sandwich on wheat bread, 2 % milk, large pretzel, frosted flake 2 cranberry cookies Baked chicken with seasoning and canned green beans Smoothie Beverages: water (2 cups), maybe some juice  Usual physical activity: work out at gym 3-4 days/week.  1 mile on treadmill and lifting weights (60 minutes)  Estimated energy needs: 2000 calories 225 g carbohydrates 150 g protein 56 g fat  Progress Towards Goal(s):  In progress.   Nutritional Diagnosis:  [place]-2.2 Altered nutrition-related laboratory As related to carbohydrates.  As evidenced by A1c 5.8%.    Intervention:  Nutrition counseling provided.  Discussed physiology of diabetes/prediabetes and how lifestyle interventions can  make a difference for long-term health.  Discussed what foods affect blood glucose levels and educated client on carb counting.  Recommended 45-60g carbs/meal and 15g carb/snack.  Recommended 3 meals/day and to avoid meal skipping (also recommended 2-3 snacks).  The most important thing for her is consistency with her meal pattern  Teaching Method Utilized:  Visual Auditory Hands on  Handouts given during visit include:  Living well with diabetes  My meal plan card  Barriers to learning/adherence to lifestyle change: planning ahead with meals and snacks  Demonstrated degree of understanding via:  Teach Back   Monitoring/Evaluation:  Dietary intake, exercise, labs, and body weight in 4 month(s).

## 2014-06-23 ENCOUNTER — Ambulatory Visit: Payer: Self-pay | Admitting: Skilled Nursing Facility1

## 2014-09-29 ENCOUNTER — Emergency Department (HOSPITAL_COMMUNITY)
Admission: EM | Admit: 2014-09-29 | Discharge: 2014-09-29 | Disposition: A | Discharge status: Home / Self Care | Payer: Medicaid Other | Source: Home / Self Care | Attending: Emergency Medicine | Admitting: Emergency Medicine

## 2014-09-29 ENCOUNTER — Encounter (HOSPITAL_COMMUNITY): Payer: Self-pay | Admitting: Emergency Medicine

## 2014-09-29 DIAGNOSIS — J0140 Acute pansinusitis, unspecified: Secondary | ICD-10-CM

## 2014-09-29 MED ORDER — FLUTICASONE PROPIONATE 50 MCG/ACT NA SUSP: 2.0000 | Freq: Every day | NASAL | Status: AC

## 2014-09-29 MED ORDER — AZITHROMYCIN 250 MG PO TABS: ORAL_TABLET | ORAL | Status: DC

## 2014-09-29 MED ORDER — OLOPATADINE HCL 0.2 % OP SOLN: 1.0000 [drp] | Freq: Every day | OPHTHALMIC | Status: AC

## 2014-09-29 NOTE — Discharge Instructions (Signed)
You likely have a sinus infection. Take azithromycin as prescribed. Continue to take Allegra. Start Flonase nasal spray. Use the eyedrops daily as needed. Follow-up with her PCP if no improvement in 1 week.

## 2014-09-29 NOTE — ED Provider Notes (Signed)
CSN: [id number]     Arrival date & time 09/29/14  1848 History   First MD Initiated Contact with Patient 09/29/14 1926     Chief Complaint  Patient presents with  . Sinusitis   (Consider location/radiation/quality/duration/timing/severity/associated sxs/prior Treatment) HPI  She is a 39 year old woman here for evaluation of sinus pressure. She states for the last 4-6 weeks she has had nasal congestion, rhinorrhea, sinus pressure. She also reports itchy watery eyes. She has been taking an over-the-counter allergy pill without improvement. She denies any fevers or chills. The sinus pressure worsens when leaning forward.  Past Medical History  Diagnosis Date  . GERD (gastroesophageal reflux disease)   . Irregular heart rate    Past Surgical History  Procedure Laterality Date  . Tubal ligation    . Cesarean section     Family History  Problem Relation Age of Onset  . Asthma Mother   . Cancer Maternal Uncle    History  Substance Use Topics  . Smoking status: Never Smoker   . Smokeless tobacco: Not on file  . Alcohol Use: No   OB History    No data available     Review of Systems As in history of present illness Allergies  Review of patient's allergies indicates no known allergies.  Home Medications   Prior to Admission medications   Medication Sig Start Date End Date Taking? Authorizing Provider  azithromycin (ZITHROMAX Z-PAK) 250 MG tablet Take 2 pills today, then 1 pill daily until gone. 09/29/14   Charm Rings, MD  calcium carbonate (TUMS - DOSED IN MG ELEMENTAL CALCIUM) 500 MG chewable tablet Chew 1 tablet by mouth daily.    Historical Provider, MD  fluticasone (FLONASE) 50 MCG/ACT nasal spray Place 2 sprays into both nostrils daily. 09/29/14 09/29/15  Charm Rings, MD  lansoprazole (PREVACID) 15 MG capsule Take 15 mg by mouth daily.    Historical Provider, MD  Multiple Vitamin (MULTIVITAMIN) tablet Take 1 tablet by mouth daily.    Historical Provider, MD  Olopatadine  HCl (PATADAY) 0.2 % SOLN Apply 1 drop to eye daily. 09/29/14   Charm Rings, MD  Probiotic Product (PROBIOTIC DAILY PO) Take by mouth.    Historical Provider, MD  psyllium (REGULOID) 0.52 G capsule Take 0.52 g by mouth daily.    Historical Provider, MD  sertraline (ZOLOFT) 25 MG tablet Take 25 mg by mouth daily.    Historical Provider, MD   BP 127/84 mmHg  Pulse 77  Temp(Src) 98.8 F (37.1 C) (Oral)  Resp 16  SpO2 99%  LMP 09/20/2014 Physical Exam  Constitutional: She is oriented to person, place, and time. She appears well-developed and well-nourished. No distress.  HENT:  Mouth/Throat: No oropharyngeal exudate.  Nasal mucosa is swollen and erythematous. Cobblestoning present.  Eyes: Conjunctivae are normal.  Bilateral lower eyelid swelling  Neck: Neck supple.  Cardiovascular: Normal rate, regular rhythm and normal heart sounds.   No murmur heard. Pulmonary/Chest: Effort normal and breath sounds normal. No respiratory distress. She has no wheezes. She has no rales.  Lymphadenopathy:    She has no cervical adenopathy.  Neurological: She is alert and oriented to person, place, and time.    ED Course  Procedures (including critical care time) Labs Review Labs Reviewed - No data to display  Imaging Review No results found.   MDM   1. Acute pansinusitis, recurrence not specified    Given duration of symptoms will treat with azithromycin. Continue allergy pill, will add  Flonase. Pataday eyedrops. Follow-up as needed.    Charm Rings, MD 09/29/14 2002

## 2014-09-29 NOTE — ED Notes (Signed)
C/o sinus infection States eyes are puffy, congested, watery eyes, headache, sinus pressure otc advil used as tx

## 2014-10-14 ENCOUNTER — Emergency Department (INDEPENDENT_AMBULATORY_CARE_PROVIDER_SITE_OTHER)
Admission: EM | Admit: 2014-10-14 | Discharge: 2014-10-14 | Disposition: A | Discharge status: Home / Self Care | Payer: Medicaid Other | Source: Home / Self Care | Attending: Family Medicine | Admitting: Family Medicine

## 2014-10-14 ENCOUNTER — Encounter (HOSPITAL_COMMUNITY): Payer: Self-pay | Admitting: Emergency Medicine

## 2014-10-14 DIAGNOSIS — J309 Allergic rhinitis, unspecified: Secondary | ICD-10-CM

## 2014-10-14 MED ORDER — FEXOFENADINE HCL 60 MG PO TABS: 60.0000 mg | ORAL_TABLET | Freq: Two times a day (BID) | ORAL | Status: AC

## 2014-10-14 NOTE — Discharge Instructions (Signed)

## 2014-10-14 NOTE — ED Notes (Signed)
Pt comes in with sinus pressure, eye drainage x 1 month ago Tried otc medication without relief

## 2014-10-14 NOTE — ED Provider Notes (Signed)
CSN: [id number]     Arrival date & time 10/14/14  1514 History   First MD Initiated Contact with Patient 10/14/14 1651     Runny nose and teary eyes  HPI  Patient is a 39 year old presenting with the above complaints. This is been ongoing for more than 2 weeks. She has tried Flonase and azithromycin with minimal relief. Her main concern currently is her teary eyes. She was also prescribed eyedrops which have not helped. Her main concern is her constant teary eyes. She denies any history of allergies in the past. Denies any sick contacts denies any fevers. Problem has been persistent. He only has minimal relief with the above-stated education. As such presented for further evaluation recommendations.  Past Medical History  Diagnosis Date  . GERD (gastroesophageal reflux disease)   . Irregular heart rate    Past Surgical History  Procedure Laterality Date  . Tubal ligation    . Cesarean section     Family History  Problem Relation Age of Onset  . Asthma Mother   . Cancer Maternal Uncle    History  Substance Use Topics  . Smoking status: Never Smoker   . Smokeless tobacco: Not on file  . Alcohol Use: No   OB History    No data available     Review of Systems  12 point review of systems reviewed and negative unless otherwise mentioned above  Allergies  Review of patient's allergies indicates no known allergies.  Home Medications   Prior to Admission medications   Medication Sig Start Date End Date Taking? Authorizing Provider  azithromycin (ZITHROMAX Z-PAK) 250 MG tablet Take 2 pills today, then 1 pill daily until gone. 09/29/14   Charm RingsErin J Honig, MD  calcium carbonate (TUMS - DOSED IN MG ELEMENTAL CALCIUM) 500 MG chewable tablet Chew 1 tablet by mouth daily.    Historical Provider, MD  fluticasone (FLONASE) 50 MCG/ACT nasal spray Place 2 sprays into both nostrils daily. 09/29/14 09/29/15  Charm RingsErin J Honig, MD  lansoprazole (PREVACID) 15 MG capsule Take 15 mg by mouth daily.     Historical Provider, MD  Multiple Vitamin (MULTIVITAMIN) tablet Take 1 tablet by mouth daily.    Historical Provider, MD  Olopatadine HCl (PATADAY) 0.2 % SOLN Apply 1 drop to eye daily. 09/29/14   Charm RingsErin J Honig, MD  Probiotic Product (PROBIOTIC DAILY PO) Take by mouth.    Historical Provider, MD  psyllium (REGULOID) 0.52 G capsule Take 0.52 g by mouth daily.    Historical Provider, MD  sertraline (ZOLOFT) 25 MG tablet Take 25 mg by mouth daily.    Historical Provider, MD   BP 106/75 mmHg  Pulse 71  Temp(Src) 98.2 F (36.8 C) (Oral)  Resp 16  SpO2 98%  LMP 09/20/2014 Physical Exam  General: Alert, awake, oriented x3, in no acute distress. HEENT: No bruits, no goiter. Heart: Regular rate and rhythm, without murmurs, rubs, gallops. Lungs: Clear to auscultation bilaterally, no wheezes. Abdomen: Soft, nontender, nondistended, positive bowel sounds. Extremities: No clubbing cyanosis or edema with positive pedal pulses. Neuro: Grossly intact, nonfocal.   ED Course  Procedures (including critical care time) Labs Review Labs Reviewed - No data to display  Imaging Review No results found.   MDM  No diagnosis found. Allergic Rhinitis  - Patient is currently taking Flonase. But is not taking oral anti-histamine. I suggested she continue taking her oral anti-histamine and will plan on having her follow-up with an allergy specialist. - Will provide prescription for  oral anti-histamine for treatment of her allergic related symptoms   Penny Pia, MD 10/14/14 1705

## 2015-02-25 ENCOUNTER — Encounter (HOSPITAL_COMMUNITY): Payer: Self-pay | Admitting: *Deleted

## 2015-02-25 ENCOUNTER — Inpatient Hospital Stay (HOSPITAL_COMMUNITY)
Admission: AD | Admit: 2015-02-25 | Discharge: 2015-02-25 | Disposition: A | Discharge status: Home / Self Care | Payer: Medicaid Other | Source: Ambulatory Visit | Attending: Obstetrics and Gynecology | Admitting: Obstetrics and Gynecology

## 2015-02-25 DIAGNOSIS — T192XXA Foreign body in vulva and vagina, initial encounter: Secondary | ICD-10-CM | POA: Diagnosis present

## 2015-02-25 DIAGNOSIS — N926 Irregular menstruation, unspecified: Secondary | ICD-10-CM

## 2015-02-25 DIAGNOSIS — Z711 Person with feared health complaint in whom no diagnosis is made: Secondary | ICD-10-CM | POA: Diagnosis not present

## 2015-02-25 NOTE — MAU Provider Note (Signed)
History     CSN: [id number]  Arrival date and time: 02/25/15 4098   First Provider Initiated Contact with Patient 02/25/15 0246      Chief Complaint  Patient presents with  . Foreign Body in Vagina   HPI [name] is a 39 y.o. female who presents for tampon removal. States she inserted a tampon Tuesday morning & has no recollection of removing it and can not find the tampon strings.  Denies vaginal discharge, abdominal pain, or fever.   OB History    No data available      Past Medical History  Diagnosis Date  . GERD (gastroesophageal reflux disease)   . Irregular heart rate     Past Surgical History  Procedure Laterality Date  . Tubal ligation    . Cesarean section      Family History  Problem Relation Age of Onset  . Asthma Mother   . Cancer Maternal Uncle     Social History  Substance Use Topics  . Smoking status: Never Smoker   . Smokeless tobacco: None  . Alcohol Use: No    Allergies: No Known Allergies  Prescriptions prior to admission  Medication Sig Dispense Refill Last Dose  . calcium carbonate (TUMS - DOSED IN MG ELEMENTAL CALCIUM) 500 MG chewable tablet Chew 1 tablet by mouth daily.   Past Month at Unknown time  . Multiple Vitamin (MULTIVITAMIN) tablet Take 1 tablet by mouth daily.   02/24/2015 at Unknown time  . azithromycin (ZITHROMAX Z-PAK) 250 MG tablet Take 2 pills today, then 1 pill daily until gone. 6 tablet 0   . fexofenadine (ALLEGRA) 60 MG tablet Take 1 tablet (60 mg total) by mouth 2 (two) times daily. 60 tablet 0   . fluticasone (FLONASE) 50 MCG/ACT nasal spray Place 2 sprays into both nostrils daily. 16 g 2   . lansoprazole (PREVACID) 15 MG capsule Take 15 mg by mouth daily.   Taking  . Olopatadine HCl (PATADAY) 0.2 % SOLN Apply 1 drop to eye daily. 2.5 mL 0   . Probiotic Product (PROBIOTIC DAILY PO) Take by mouth.   Taking  . psyllium (REGULOID) 0.52 G capsule Take 0.52 g by mouth daily.   Taking  . sertraline (ZOLOFT) 25 MG  tablet Take 25 mg by mouth daily.   Taking    Review of Systems  Constitutional: Negative.   Gastrointestinal: Negative.   Genitourinary: Negative for dysuria.       + vaginal bleeding No vaginal discharge   Physical Exam   Blood pressure 100/70, pulse 64, temperature 97.7 F (36.5 C), temperature source Oral, resp. rate 16, last menstrual period 02/23/2015, SpO2 98 %.  Physical Exam  Nursing note and vitals reviewed. Constitutional: She is oriented to person, place, and time. She appears well-developed and well-nourished. No distress.  HENT:  Head: Normocephalic and atraumatic.  Eyes: Conjunctivae are normal. Right eye exhibits no discharge. Left eye exhibits no discharge. No scleral icterus.  Neck: Normal range of motion.  Cardiovascular: Normal rate.   Respiratory: Effort normal. No respiratory distress.  GI: Soft. There is no tenderness.  Genitourinary: Vagina normal.  Small amount of dark red blood appropriate for menstruation.  No foreign body/tampon visualized on exam.  No foreign body found during digital exam.   Neurological: She is alert and oriented to person, place, and time.  Skin: Skin is warm and dry. She is not diaphoretic.  Psychiatric: She has a normal mood and affect. Her behavior is normal.  Judgment and thought content normal.    MAU Course  Procedures  MDM No foreign body in vagina.   Assessment and Plan  A: 1. Menstrual problem    P: Discharge home Discussed reasons to return to MAU  Judeth HornErin Glynis Hunsucker, NP  02/25/2015, 2:03 AM

## 2015-02-25 NOTE — Discharge Instructions (Signed)
°  Your exam today was normal.  Return for evaluation if you have vaginal discharge, severe abdominal pain, or fever.

## 2015-02-25 NOTE — MAU Note (Signed)
Pt reports she put a tampon in at 9am today and when she cannot find the string and thinks the tampon is still in there.

## 2015-10-20 MED FILL — FLUCONAZOLE 150 MG TABLET: 150 | 7 days supply | Qty: 3 | Fill #0

## 2015-11-26 MED FILL — FLUCONAZOLE 150 MG TABLET: 150 | 7 days supply | Qty: 3 | Fill #0

## 2015-11-26 MED FILL — CLINDAMYCIN HCL 300 MG CAPS: 300 | 7 days supply | Qty: 14 | Fill #0

## 2015-12-23 MED FILL — HYDROCODON-APAP 10-325: 10-325 | 4 days supply | Qty: 20 | Fill #0

## 2015-12-23 MED FILL — CHLORHEXIDINE 0.12% RINSE: 0.12 | 16 days supply | Qty: 473 | Fill #0

## 2015-12-23 MED FILL — AMOXICILLIN 500 MG CAPSULE: 500 | 7 days supply | Qty: 21 | Fill #0

## 2015-12-23 MED FILL — DEXAMETHASONE 4 MG TABLET: 4 | 3 days supply | Qty: 9 | Fill #0

## 2016-05-09 MED FILL — CHLORHEXIDINE 0.12% RINSE: 0.12 | 16 days supply | Qty: 473 | Fill #0

## 2016-05-09 MED FILL — HYDROCODON-APAP 5-325: 5-325 | 2 days supply | Qty: 10 | Fill #0

## 2016-05-09 MED FILL — AMOXICILLIN 500 MG CAPSULE: 500 | 7 days supply | Qty: 21 | Fill #0

## 2016-06-10 MED FILL — MECLIZINE 25 MG TABLET: 25 | 20 days supply | Qty: 60 | Fill #0

## 2016-10-10 MED FILL — TINIDAZOLE 500 MG TABLET: 500 | 2 days supply | Qty: 8 | Fill #0

## 2017-06-06 ENCOUNTER — Other Ambulatory Visit: Payer: Self-pay | Admitting: Family Medicine

## 2017-06-06 ENCOUNTER — Other Ambulatory Visit (HOSPITAL_COMMUNITY)
Admission: RE | Admit: 2017-06-06 | Discharge: 2017-06-06 | Disposition: A | Discharge status: Home / Self Care | Payer: BLUE CROSS/BLUE SHIELD | Source: Ambulatory Visit | Attending: Family Medicine | Admitting: Family Medicine

## 2017-06-06 DIAGNOSIS — Z124 Encounter for screening for malignant neoplasm of cervix: Secondary | ICD-10-CM | POA: Insufficient documentation

## 2017-06-12 LAB — CYTOLOGY - PAP
Bacterial vaginitis: NEGATIVE
Candida vaginitis: NEGATIVE
Chlamydia: NEGATIVE
Diagnosis: NEGATIVE
HPV (WINDOPATH): NOT DETECTED
Herpes: NEGATIVE
Neisseria Gonorrhea: NEGATIVE
Trichomonas: NEGATIVE

## 2017-11-23 MED FILL — SERTRALINE HCL 25 MG TABLET: 25 | 90 days supply | Qty: 90 | Fill #0

## 2018-01-11 LAB — GLUCOSE, POCT (MANUAL RESULT ENTRY): POC GLUCOSE: 81 mg/dL (ref 70–99)

## 2018-02-23 MED FILL — SERTRALINE HCL 25 MG TABLET: 25 | 90 days supply | Qty: 90 | Fill #1

## 2018-02-23 MED FILL — FLUCONAZOLE 150 MG TABS: 150 | 7 days supply | Qty: 3 | Fill #0

## 2018-05-17 MED FILL — TINIDAZOLE 500 MG TABS: 500 | 2 days supply | Qty: 8 | Fill #0

## 2018-05-17 MED FILL — FLUCONAZOLE 150 MG TABS: 150 | 1 days supply | Qty: 1 | Fill #0

## 2018-11-21 ENCOUNTER — Other Ambulatory Visit: Payer: Self-pay

## 2018-11-21 DIAGNOSIS — Z20822 Contact with and (suspected) exposure to covid-19: Secondary | ICD-10-CM

## 2018-11-23 ENCOUNTER — Telehealth: Payer: Self-pay | Admitting: Family Medicine

## 2018-11-23 LAB — NOVEL CORONAVIRUS, NAA: SARS-CoV-2, NAA: NOT DETECTED

## 2018-11-23 NOTE — Telephone Encounter (Unsigned)
Copied from [place] [phone]. Topic: General - Other >> Nov 23, 2018  5:30 PM Yvette Rack wrote: Reason for CRM: Pt requests that the negative Covid-19 test results be mailed to her home. Address verified.

## 2018-11-23 NOTE — Telephone Encounter (Signed)
Results to be mailed as requested.  

## 2019-02-19 MED FILL — SERTRALINE HCL 25 MG TABLET: 25 | 90 days supply | Qty: 90 | Fill #0

## 2019-03-06 ENCOUNTER — Other Ambulatory Visit: Payer: Self-pay

## 2019-03-06 DIAGNOSIS — Z20822 Contact with and (suspected) exposure to covid-19: Secondary | ICD-10-CM

## 2019-03-08 LAB — NOVEL CORONAVIRUS, NAA: SARS-CoV-2, NAA: NOT DETECTED

## 2019-04-17 ENCOUNTER — Ambulatory Visit: Payer: 59 | Attending: Internal Medicine

## 2019-04-17 DIAGNOSIS — Z20822 Contact with and (suspected) exposure to covid-19: Secondary | ICD-10-CM | POA: Insufficient documentation

## 2019-04-19 LAB — NOVEL CORONAVIRUS, NAA: SARS-CoV-2, NAA: NOT DETECTED

## 2019-04-23 ENCOUNTER — Ambulatory Visit: Payer: Self-pay | Admitting: *Deleted

## 2019-04-23 NOTE — Telephone Encounter (Signed)
Wants results of COVID mailed for her and her children, will send request for [facility].  Reason for Disposition . COVID-19 Testing, questions about  Answer Assessment - Initial Assessment Questions 1. COVID-19 DIAGNOSIS: "Who made your Coronavirus (COVID-19) diagnosis?" "Was it confirmed by a positive lab test?" If not diagnosed by a HCP, ask "Are there lots of cases (community spread) where you live?" (See public health department website, if unsure)     [facility] 2. COVID-19 EXPOSURE: "Was there any known exposure to COVID before the symptoms began?" CDC Definition of close contact: within 6 feet (2 meters) for a total of 15 minutes or more over a 24-hour period.      na 3. ONSET: "When did the COVID-19 symptoms start?"      na 4. WORST SYMPTOM: "What is your worst symptom?" (e.g., cough, fever, shortness of breath, muscle aches)     na 5. COUGH: "Do you have a cough?" If so, ask: "How bad is the cough?"       na 6. FEVER: "Do you have a fever?" If so, ask: "What is your temperature, how was it measured, and when did it start?"     na 7. RESPIRATORY STATUS: "Describe your breathing?" (e.g., shortness of breath, wheezing, unable to speak)      Na 8. BETTER-SAME-WORSE: "Are you getting better, staying the same or getting worse compared to yesterday?"  If getting worse, ask, "In what way?"     na 9. HIGH RISK DISEASE: "Do you have any chronic medical problems?" (e.g., asthma, heart or lung disease, weak immune system, obesity, etc.)     na 10. PREGNANCY: "Is there any chance you are pregnant?" "When was your last menstrual period?"       na 11. OTHER SYMPTOMS: "Do you have any other symptoms?"  (e.g., chills, fatigue, headache, loss of smell or taste, muscle pain, sore throat; new loss of smell or taste especially support the diagnosis of COVID-19)       na  Protocols used: CORONAVIRUS (COVID-19) DIAGNOSED OR SUSPECTED-A-AH

## 2019-04-30 ENCOUNTER — Ambulatory Visit: Payer: 59 | Attending: Internal Medicine

## 2019-04-30 DIAGNOSIS — Z20822 Contact with and (suspected) exposure to covid-19: Secondary | ICD-10-CM | POA: Insufficient documentation

## 2019-05-01 LAB — NOVEL CORONAVIRUS, NAA: SARS-CoV-2, NAA: NOT DETECTED

## 2019-05-23 ENCOUNTER — Ambulatory Visit: Payer: 59 | Attending: Internal Medicine

## 2019-05-23 DIAGNOSIS — Z20822 Contact with and (suspected) exposure to covid-19: Secondary | ICD-10-CM

## 2019-05-24 LAB — NOVEL CORONAVIRUS, NAA: SARS-CoV-2, NAA: NOT DETECTED

## 2019-06-11 ENCOUNTER — Telehealth: Payer: Self-pay

## 2019-06-11 NOTE — Telephone Encounter (Signed)
Pt was tested for covid and received a negative test result but was exposed to Covid by her husband shortly after. She was experiencing symptoms and quarantined herself for 12 days. She wants to know if she should be tested again. Will have nurse reach out to her for support.   [name]

## 2019-06-11 NOTE — Telephone Encounter (Signed)
Attempted to contact patient. Mail box is full unable to leave message.

## 2019-06-11 NOTE — Telephone Encounter (Signed)
Attempted to contact patient. Mail box is full unable to leave message. 

## 2019-06-12 ENCOUNTER — Ambulatory Visit: Payer: 59 | Attending: Internal Medicine

## 2019-06-12 DIAGNOSIS — Z20822 Contact with and (suspected) exposure to covid-19: Secondary | ICD-10-CM

## 2019-06-13 LAB — NOVEL CORONAVIRUS, NAA: SARS-CoV-2, NAA: NOT DETECTED

## 2019-07-10 ENCOUNTER — Ambulatory Visit (INDEPENDENT_AMBULATORY_CARE_PROVIDER_SITE_OTHER): Payer: 59 | Admitting: Otolaryngology

## 2019-07-10 ENCOUNTER — Encounter (INDEPENDENT_AMBULATORY_CARE_PROVIDER_SITE_OTHER): Payer: Self-pay | Admitting: Otolaryngology

## 2019-07-10 ENCOUNTER — Other Ambulatory Visit: Payer: Self-pay

## 2019-07-10 VITALS — Temp 97.9°F

## 2019-07-10 DIAGNOSIS — H6123 Impacted cerumen, bilateral: Secondary | ICD-10-CM

## 2019-07-10 DIAGNOSIS — J310 Chronic rhinitis: Secondary | ICD-10-CM | POA: Diagnosis not present

## 2019-07-10 NOTE — Progress Notes (Signed)
HPI: [name] is a 44 y.o. female who presents for evaluation of ear complaints.  She complains of pressure predominately in the left ear and wanted her ears cleaned.  She apparently had Covid in February and lost her sense of smell and taste associated with this.  The taste and smell improved over the ensuing week but she has not recovered 100% of it back yet.  She is also has some pressure in the left ear especially when she sleeps at night.  This comes and goes. She has not noted any hearing problems.  Past Medical History:  Diagnosis Date  . GERD (gastroesophageal reflux disease)   . Irregular heart rate    Past Surgical History:  Procedure Laterality Date  . CESAREAN SECTION    . TUBAL LIGATION     Social History   Socioeconomic History  . Marital status: Married    Spouse name: Not on file  . Number of children: Not on file  . Years of education: Not on file  . Highest education level: Not on file  Occupational History  . Not on file  Tobacco Use  . Smoking status: Never Smoker  . Smokeless tobacco: Never Used  Substance and Sexual Activity  . Alcohol use: No  . Drug use: No  . Sexual activity: Yes    Birth control/protection: Surgical  Other Topics Concern  . Not on file  Social History Narrative  . Not on file   Social Determinants of Health   Financial Resource Strain:   . Difficulty of Paying Living Expenses:   Food Insecurity:   . Worried About Charity fundraiser in the Last Year:   . Arboriculturist in the Last Year:   Transportation Needs:   . Film/video editor (Medical):   Marland Kitchen Lack of Transportation (Non-Medical):   Physical Activity:   . Days of Exercise per Week:   . Minutes of Exercise per Session:   Stress:   . Feeling of Stress :   Social Connections:   . Frequency of Communication with Friends and Family:   . Frequency of Social Gatherings with Friends and Family:   . Attends Religious Services:   . Active Member of Clubs or  Organizations:   . Attends Archivist Meetings:   Marland Kitchen Marital Status:    Family History  Problem Relation Age of Onset  . Asthma Mother   . Cancer Maternal Uncle    No Known Allergies Prior to Admission medications   Medication Sig Start Date End Date Taking? Authorizing Provider  calcium carbonate (TUMS - DOSED IN MG ELEMENTAL CALCIUM) 500 MG chewable tablet Chew 1 tablet by mouth daily.   Yes [provider]  fexofenadine (ALLEGRA) 60 MG tablet Take 1 tablet (60 mg total) by mouth 2 (two) times daily. 10/14/14  Yes Velvet Bathe, MD  Multiple Vitamin (MULTIVITAMIN) tablet Take 1 tablet by mouth daily.   Yes [provider]  Olopatadine HCl (PATADAY) 0.2 % SOLN Apply 1 drop to eye daily. 09/29/14  Yes Melony Overly, MD  azithromycin (ZITHROMAX Z-PAK) 250 MG tablet Take 2 pills today, then 1 pill daily until gone. Patient not taking: Reported on 07/10/2019 09/29/14   Melony Overly, MD  fluticasone [facility]) 50 MCG/ACT nasal spray Place 2 sprays into both nostrils daily. 09/29/14 09/29/15  Melony Overly, MD     Positive ROS: Otherwise negative  All other systems have been reviewed and were otherwise negative with  the exception of those mentioned in the HPI and as above.  Physical Exam: Constitutional: Alert, well-appearing, no acute distress Ears: External ears without lesions or tenderness.  She had minimal wax buildup in both ears that was cleaned with a curette and forceps.  This was nonobstructing in TMs were clear bilaterally with good mobility on pneumatic otoscopy.  On hearing screening with a 512 1024 tuning fork she heard well in both ears with AC > BC bilaterally. Nasal: External nose without lesions. Septum midline with mild to moderate rhinitis..  Nasal passages were otherwise clear with a clear middle meatus bilaterally.  No signs of infection. Oral: Lips and gums without lesions. Tongue and palate mucosa without lesions. Posterior oropharynx clear. Neck:  No palpable adenopathy or masses.  On palpation of the TMJ regions there was no pain or discomfort or TMJ abnormality noted. Respiratory: Breathing comfortably  Skin: No facial/neck lesions or rash noted.  Cerumen impaction removal  Date/Time: 07/10/2019 1:11 PM Performed by: Drema Halon, MD Authorized by: Drema Halon, MD   Consent:    Consent obtained:  Verbal   Consent given by:  Patient   Risks discussed:  Pain and bleeding Procedure details:    Location:  L ear and R ear   Procedure type: curette and forceps   Post-procedure details:    Inspection:  TM intact and canal normal   Hearing quality:  Improved   Patient tolerance of procedure:  Tolerated well, no immediate complications Comments:     TMs were clear bilaterally.    Assessment: Left ear fullness may be related to eustachian tube dysfunction versus positioning when she sleeps. Minimal cerumen buildup  Plan: Ear canals were cleaned in the office. She does have mild rhinitis and suggested using Nasacort or Flonase on a daily basis for 2 weeks to see if this helps at all with the ear pressure.  Narda Bonds, MD

## 2020-04-07 ENCOUNTER — Other Ambulatory Visit (HOSPITAL_COMMUNITY): Payer: Self-pay | Admitting: Obstetrics and Gynecology

## 2020-04-07 MED FILL — FLUCONAZOLE 150 MG TABS: 150 | 1 days supply | Qty: 1 | Fill #0

## 2020-04-07 MED FILL — NYSTATIN-TRIAMCINOLONE CRM: 100000-0.1 | 7 days supply | Qty: 15 | Fill #0

## 2020-05-26 MED FILL — FLUCONAZOLE 150 MG TABS: 150 | 1 days supply | Qty: 1 | Fill #0

## 2020-11-20 ENCOUNTER — Other Ambulatory Visit (HOSPITAL_COMMUNITY): Payer: Self-pay

## 2020-11-20 MED FILL — Fluconazole Tab 150 MG: ORAL | 1 days supply | Qty: 1 | Fill #0 | Status: AC

## 2021-03-02 ENCOUNTER — Other Ambulatory Visit (HOSPITAL_COMMUNITY): Payer: Self-pay

## 2021-03-03 ENCOUNTER — Other Ambulatory Visit (HOSPITAL_COMMUNITY): Payer: Self-pay

## 2021-03-11 ENCOUNTER — Other Ambulatory Visit (HOSPITAL_COMMUNITY): Payer: Self-pay

## 2021-03-17 ENCOUNTER — Other Ambulatory Visit (HOSPITAL_COMMUNITY): Payer: Self-pay

## 2021-03-19 ENCOUNTER — Other Ambulatory Visit (HOSPITAL_COMMUNITY): Payer: Self-pay

## 2021-03-19 MED ORDER — FLUCONAZOLE 150 MG PO TABS
ORAL_TABLET | ORAL | 1 refills | Status: AC
  Filled 2021-03-19 – 2021-05-07 (×2): qty 1, 1d supply, fill #0

## 2021-03-29 ENCOUNTER — Other Ambulatory Visit (HOSPITAL_COMMUNITY): Payer: Self-pay

## 2021-05-07 ENCOUNTER — Other Ambulatory Visit (HOSPITAL_COMMUNITY): Payer: Self-pay

## 2022-05-12 NOTE — Progress Notes (Signed)
CARDIOLOGY CONSULT NOTE       Patient ID: [name] MRN: [id number] DOB/AGE: 10-31-1975 47 y.o.  Admit date: (Not on file) Referring Physician: Self Primary Physician: Lucianne Lei, MD Primary Cardiologist: New Reason for Consultation: tachycardia    HPI:  47 y.o. self referred for tachycardia. Apparently I saw her in 2008 but records have not transferred to [place].  She has a history of "irregular" heart beats and GERD.   She has 5 kids ages 25-28 now. Works as a Catering manager She is active and works at at [employer] No cardiac symptoms  Indicates noting some irregular pulses on her apple watch. Also indicates HR high at rest at times sitting can be 100 bpm. No chest pain dyspnea or syncope  No overuse of caffeine or stimulants Labs with primary normal per her account including thyroid   ROS All other systems reviewed and negative except as noted above  Past Medical History:  Diagnosis Date   GERD (gastroesophageal reflux disease)    Irregular heart rate     Family History  Problem Relation Age of Onset   Asthma Mother    Cancer Maternal Uncle     Social History   Socioeconomic History   Marital status: Married    Spouse name: Not on file   Number of children: Not on file   Years of education: Not on file   Highest education level: Not on file  Occupational History   Not on file  Tobacco Use   Smoking status: Never   Smokeless tobacco: Never  Substance and Sexual Activity   Alcohol use: No   Drug use: No   Sexual activity: Yes    Birth control/protection: Surgical  Other Topics Concern   Not on file  Social History Narrative   Not on file   Social Determinants of Health   Financial Resource Strain: Not on file  Food Insecurity: Not on file  Transportation Needs: Not on file  Physical Activity: Not on file  Stress: Not on file  Social Connections: Not on file  Intimate Partner Violence: Not on file    Past Surgical History:   Procedure Laterality Date   CESAREAN SECTION     TUBAL LIGATION        Current Outpatient Medications:    azithromycin (ZITHROMAX Z-PAK) 250 MG tablet, Take 2 pills today, then 1 pill daily until gone. (Patient not taking: Reported on 07/10/2019), Disp: 6 tablet, Rfl: 0   calcium carbonate (TUMS - DOSED IN MG ELEMENTAL CALCIUM) 500 MG chewable tablet, Chew 1 tablet by mouth daily., Disp: , Rfl:    fexofenadine (ALLEGRA) 60 MG tablet, Take 1 tablet (60 mg total) by mouth 2 (two) times daily., Disp: 60 tablet, Rfl: 0   fluticasone (FLONASE) 50 MCG/ACT nasal spray, Place 2 sprays into both nostrils daily., Disp: 16 g, Rfl: 2   Multiple Vitamin (MULTIVITAMIN) tablet, Take 1 tablet by mouth daily., Disp: , Rfl:    Olopatadine HCl (PATADAY) 0.2 % SOLN, Apply 1 drop to eye daily., Disp: 2.5 mL, Rfl: 0    Physical Exam: There were no vitals taken for this visit.    Affect appropriate Healthy:  appears stated age 47: normal Neck supple with no adenopathy JVP normal no bruits no thyromegaly Lungs clear with no wheezing and good diaphragmatic motion Heart:  S1/S2 no murmur, no rub, gallop or click PMI normal Abdomen: benighn, BS positve, no tenderness, no AAA no bruit.  No HSM or  HJR Distal pulses intact with no bruits No edema Neuro non-focal Skin warm and dry No muscular weakness   Labs:   Lab Results  Component Value Date   WBC 7.0 06/02/2009   HGB 10.3 (L) 06/02/2009   HCT 30.9 (L) 06/02/2009   MCV 87.7 06/02/2009   PLT 185 06/02/2009   No results for input(s): "NA", "K", "CL", "CO2", "BUN", "CREATININE", "CALCIUM", "PROT", "BILITOT", "ALKPHOS", "ALT", "AST", "GLUCOSE" in the last 168 hours.  Invalid input(s): "LABALBU" No results found for: "CKTOTAL", "CKMB", "CKMBINDEX", "TROPONINI" No results found for: "CHOL" No results found for: "HDL" No results found for: "LDLCALC" No results found for: "TRIG" No results found for: "CHOLHDL" No results found for: "LDLDIRECT"     Radiology: No results found.  EKG: NSR normal ECG rate 68   ASSESSMENT AND PLAN:   Tachcyardia:  benign no arrhythmia normal ECG Discussed using Kardia Mobile device rather than just apple watch for more accurate reading of rhythm and HR    F/U PRN   Signed: Jenkins Rouge 05/20/2022, 9:45 AM

## 2022-05-20 ENCOUNTER — Ambulatory Visit: Payer: 59 | Attending: Cardiovascular Disease | Admitting: Cardiovascular Disease

## 2022-05-20 VITALS — BP 100/60 | HR 75 | Ht 66.0 in | Wt 136.0 lb

## 2022-05-20 DIAGNOSIS — R002 Palpitations: Secondary | ICD-10-CM

## 2022-05-20 DIAGNOSIS — R000 Tachycardia, unspecified: Secondary | ICD-10-CM

## 2022-05-20 NOTE — Patient Instructions (Addendum)
Medication Instructions:  Your physician recommends that you continue on your current medications as directed. Please refer to the Current Medication list given to you today.  *If you need a refill on your cardiac medications before your next appointment, please call your pharmacy*  Lab Work: If you have labs (blood work) drawn today and your tests are completely normal, you will receive your results only by: [place] (if you have MyChart) OR A paper copy in the mail If you have any lab test that is abnormal or we need to change your treatment, we will call you to review the results.  Follow-Up: At [facility], you and your health needs are our priority.  As part of our continuing mission to provide you with exceptional heart care, we have created designated Provider Care Teams.  These Care Teams include your primary Cardiologist (physician) and Advanced Practice Providers (APPs -  Physician Assistants and Nurse Practitioners) who all work together to provide you with the care you need, when you need it.  We recommend signing up for the patient portal called "MyChart".  Sign up information is provided on this After Visit Summary.  MyChart is used to connect with patients for Virtual Visits (Telemedicine).  Patients are able to view lab/test results, encounter notes, upcoming appointments, etc.  Non-urgent messages can be sent to your provider as well.   To learn more about what you can do with MyChart, go to [email or web address].    Your next appointment:   As needed  Provider:   Jenkins Rouge, MD

## 2022-09-07 DIAGNOSIS — K644 Residual hemorrhoidal skin tags: Secondary | ICD-10-CM | POA: Diagnosis not present

## 2022-09-07 DIAGNOSIS — R140 Abdominal distension (gaseous): Secondary | ICD-10-CM | POA: Diagnosis not present

## 2022-09-09 DIAGNOSIS — Z1231 Encounter for screening mammogram for malignant neoplasm of breast: Secondary | ICD-10-CM | POA: Diagnosis not present

## 2022-10-07 ENCOUNTER — Other Ambulatory Visit (HOSPITAL_COMMUNITY): Payer: Self-pay

## 2022-10-07 DIAGNOSIS — R142 Eructation: Secondary | ICD-10-CM | POA: Diagnosis not present

## 2022-10-07 MED ORDER — PEG 3350-KCL-NA BICARB-NACL 420 G PO SOLR
ORAL | 0 refills | Status: AC
  Filled 2022-10-07: qty 4000, 1d supply, fill #0

## 2022-10-07 MED ORDER — OMEPRAZOLE 20 MG PO CPDR
20.0000 mg | DELAYED_RELEASE_CAPSULE | Freq: Every morning | ORAL | 0 refills | Status: AC
  Filled 2022-10-07: qty 30, 30d supply, fill #0

## 2022-10-19 DIAGNOSIS — Z1211 Encounter for screening for malignant neoplasm of colon: Secondary | ICD-10-CM | POA: Diagnosis not present

## 2022-10-19 DIAGNOSIS — K635 Polyp of colon: Secondary | ICD-10-CM | POA: Diagnosis not present

## 2022-11-07 DIAGNOSIS — R142 Eructation: Secondary | ICD-10-CM | POA: Diagnosis not present

## 2022-11-07 DIAGNOSIS — Z13220 Encounter for screening for lipoid disorders: Secondary | ICD-10-CM | POA: Diagnosis not present

## 2022-11-07 DIAGNOSIS — R7309 Other abnormal glucose: Secondary | ICD-10-CM | POA: Diagnosis not present

## 2022-11-07 DIAGNOSIS — D72819 Decreased white blood cell count, unspecified: Secondary | ICD-10-CM | POA: Diagnosis not present

## 2022-11-07 DIAGNOSIS — Z0000 Encounter for general adult medical examination without abnormal findings: Secondary | ICD-10-CM | POA: Diagnosis not present

## 2023-10-18 LAB — AMB RESULTS CONSOLE CBG: Glucose: 93

## 2023-10-18 NOTE — Progress Notes (Signed)
Declined SDOH.

## 2024-01-12 NOTE — Progress Notes (Signed)
 The patient attended a screening event on 10/18/2023, where her blood pressure was measured at 134/98 mmHg after second check, and her non-fasting glucose was 93 mg/dl. During the event, the patient reported that she does not smoke, has [employer] as her insurance, is established with a primary care provider Chief Financial Officer), and declined SDOH screener.  A chart review indicates Dr. Cher[facility] as her PCP. She does not have any office visits with him within the last 12 months.   Call Attempt #1: CHW called pt to discuss screening event. Pt shared she is on lunch and unable to speak at the moment. Pt asked CHW to call back around 4pm.   Call Attempt #2: CHW called pt back to discuss screening event. CHW was unable to leave vm.  Call Attempt #3: Pt shared she went to her PCP with [facility] in early August 2025. Pt shared is interested in attending healthy cooking class and wishing to obtain any healthy eating educational materials.  Call to PCP Office: CHW called pt's PCP office to obtain exact appt date since screening event. PCP shared she was seen 11/08/23.  CHW sent email and letter to pt with healthy cooking class flyer and bp educational materials in case needed by pt.   At this time, no additional support from the Health Equity Team is indicated.

## 2024-02-15 ENCOUNTER — Telehealth: Payer: Self-pay

## 2024-02-15 ENCOUNTER — Other Ambulatory Visit: Payer: Self-pay

## 2024-02-15 DIAGNOSIS — N644 Mastodynia: Secondary | ICD-10-CM

## 2024-02-15 NOTE — Telephone Encounter (Signed)
 Patient called and stated she has discomfort, tingling, burning sensation in left breast x 1 month. Patient states menstrual cycle has been irregular, seems to be no longer than 23 days, may be close to starting again, does experience breast tenderness in both breasts prior to menstrual cycle. Patient denies any lumps, discharge, skin changes (around left breast), and family history of breast cancer. Patient stated she has no insurance and is very concerned. Patient informed needs to be evaluated in [facility]. Transferred to [employer] [employer]. Orders for diagnostic mammogram and ultrasound placed.

## 2024-02-16 ENCOUNTER — Encounter: Payer: Self-pay | Admitting: Obstetrics and Gynecology

## 2024-03-26 ENCOUNTER — Ambulatory Visit: Admitting: *Deleted

## 2024-03-26 VITALS — Wt 142.7 lb

## 2024-03-26 DIAGNOSIS — N6325 Unspecified lump in the left breast, overlapping quadrants: Secondary | ICD-10-CM

## 2024-03-26 DIAGNOSIS — Z1239 Encounter for other screening for malignant neoplasm of breast: Secondary | ICD-10-CM

## 2024-03-26 DIAGNOSIS — N6311 Unspecified lump in the right breast, upper outer quadrant: Secondary | ICD-10-CM

## 2024-03-26 DIAGNOSIS — Z1211 Encounter for screening for malignant neoplasm of colon: Secondary | ICD-10-CM

## 2024-03-26 NOTE — Patient Instructions (Signed)
 Explained breast self awareness with Fernande GORMAN Mane. Patient did not need a Pap smear today due to last Pap smear and HPV typing was 11/08/2023. Let her know BCCCP will cover Pap smears and HPV typing every 5 years unless has a history of abnormal Pap smears. Referred patient to [facility] for a diagnostic mammogram. Appointment scheduled Tuesday, March 26, 2024 at 1430. Patient aware of appointment and will be there. Mykah Bellomo Tocco verbalized understanding.  Remington Highbaugh, Wanda Ship, RN 1:50 PM

## 2024-03-26 NOTE — Progress Notes (Signed)
 Ms. [name] is a 48 y.o. female who presents to [facility] clinic today with complaint of left diffuse breast pain since October 2025. Patient states the pain comes and goes. Patient rates the pain mild.    Pap Smear: Pap smear not completed today. Last Pap smear was 11/08/2023 at [facility] at [facility] and was normal with negative HPV. Per patient has history of an abnormal Pap smear 20 years ago that a colposcopy was completed for follow up. Patient stated that all Pap smears have been normal since colposcopy and that she has Pap smears yearly. Last Pap smear result is available in Epic.   Physical exam: Breasts Right breast larger than left breast that per patient is normal for her. No skin abnormalities bilateral breasts. No nipple retraction bilateral breasts. No nipple discharge bilateral breasts. No lymphadenopathy. Palpated a mobile pea sized lump within the right breast at 11 o'clock 3 cm from the nipple. Palpated a mobile pea sized lump within the left breast at 12 o'clock 2 cm from the nipple. No complaints of pain or tenderness on exam.   Pelvic/Bimanual Pap is not indicated today per BCCCP guidelines.   Smoking History: Patient has never smoked.   Patient Navigation: Patient education provided. Access to services provided for patient through [facility] program.   Colorectal Cancer Screening: Per patient had a colonoscopy completed in June or July 2024. FIT Test given to patient to complete. No complaints today.    Breast and Cervical Cancer Risk Assessment: Patient does not have family history of breast cancer, known genetic mutations, or radiation treatment to the chest before age 24. Per patient has history of cervical dysplasia. Patient has no history of being immunocompromised or DES exposure in-utero.  Risk Scores as of Encounter on 03/26/2024     Alisa           5-year 0.99%   Lifetime 9.03%            Last calculated by Logan Lyle BRAVO, CMA on  03/26/2024 at  1:48 PM        A: BCCCP exam without pap smear Complaint of left breast pain.  P: Referred patient to [facility] for a diagnostic mammogram. Appointment scheduled Tuesday, March 26, 2024 at 1430.  Driscilla Wanda SQUIBB, RN 03/26/2024 1:50 PM

## 2024-03-28 ENCOUNTER — Ambulatory Visit

## 2024-04-05 ENCOUNTER — Other Ambulatory Visit: Payer: Self-pay

## 2024-04-05 HISTORY — PX: BREAST BIOPSY: SHX20

## 2024-04-08 LAB — SURGICAL PATHOLOGY

## 2024-04-09 ENCOUNTER — Ambulatory Visit

## 2024-04-09 ENCOUNTER — Other Ambulatory Visit

## 2024-04-09 ENCOUNTER — Encounter

## 2024-04-10 ENCOUNTER — Other Ambulatory Visit: Payer: Self-pay | Admitting: Radiology

## 2024-04-10 DIAGNOSIS — R9230 Dense breasts, unspecified: Secondary | ICD-10-CM

## 2024-04-12 ENCOUNTER — Encounter: Payer: Self-pay | Admitting: Obstetrics and Gynecology
